# Patient Record
Sex: Female | Born: 1965 | Race: White | Hispanic: No | Smoking: Former smoker
Health system: Southern US, Community
[De-identification: ages and names within clinical notes are randomized; demographics above are authoritative.]

## PROBLEM LIST (undated history)

## (undated) DIAGNOSIS — F419 Anxiety disorder, unspecified: Secondary | ICD-10-CM

## (undated) DIAGNOSIS — Z87442 Personal history of urinary calculi: Secondary | ICD-10-CM

## (undated) DIAGNOSIS — M1990 Unspecified osteoarthritis, unspecified site: Secondary | ICD-10-CM

## (undated) DIAGNOSIS — F32A Depression, unspecified: Secondary | ICD-10-CM

## (undated) DIAGNOSIS — IMO0002 Reserved for concepts with insufficient information to code with codable children: Secondary | ICD-10-CM

## (undated) DIAGNOSIS — D649 Anemia, unspecified: Secondary | ICD-10-CM

## (undated) DIAGNOSIS — T7840XA Allergy, unspecified, initial encounter: Secondary | ICD-10-CM

## (undated) DIAGNOSIS — F909 Attention-deficit hyperactivity disorder, unspecified type: Secondary | ICD-10-CM

## (undated) DIAGNOSIS — J45909 Unspecified asthma, uncomplicated: Secondary | ICD-10-CM

## (undated) DIAGNOSIS — G709 Myoneural disorder, unspecified: Secondary | ICD-10-CM

## (undated) DIAGNOSIS — G35 Multiple sclerosis: Secondary | ICD-10-CM

## (undated) DIAGNOSIS — E039 Hypothyroidism, unspecified: Secondary | ICD-10-CM

## (undated) DIAGNOSIS — Z5189 Encounter for other specified aftercare: Secondary | ICD-10-CM

## (undated) HISTORY — DX: Anemia, unspecified: D64.9

## (undated) HISTORY — PX: ABDOMINAL HYSTERECTOMY: SHX81

## (undated) HISTORY — DX: Depression, unspecified: F32.A

## (undated) HISTORY — PX: APPENDECTOMY: SHX54

## (undated) HISTORY — DX: Anxiety disorder, unspecified: F41.9

## (undated) HISTORY — DX: Reserved for concepts with insufficient information to code with codable children: IMO0002

## (undated) HISTORY — DX: Unspecified osteoarthritis, unspecified site: M19.90

## (undated) HISTORY — DX: Encounter for other specified aftercare: Z51.89

## (undated) HISTORY — PX: CHOLECYSTECTOMY: SHX55

## (undated) HISTORY — DX: Allergy, unspecified, initial encounter: T78.40XA

---

## 2021-09-09 ENCOUNTER — Other Ambulatory Visit: Payer: Self-pay

## 2021-09-09 ENCOUNTER — Encounter (INDEPENDENT_AMBULATORY_CARE_PROVIDER_SITE_OTHER): Payer: Self-pay

## 2021-09-09 ENCOUNTER — Encounter: Payer: Self-pay | Admitting: Family Medicine

## 2021-09-09 ENCOUNTER — Ambulatory Visit (INDEPENDENT_AMBULATORY_CARE_PROVIDER_SITE_OTHER): Payer: Medicare Other | Admitting: Family Medicine

## 2021-09-09 VITALS — BP 119/79 | HR 65 | Temp 97.8°F | Resp 16 | Ht 62.5 in | Wt 219.0 lb

## 2021-09-09 DIAGNOSIS — F988 Other specified behavioral and emotional disorders with onset usually occurring in childhood and adolescence: Secondary | ICD-10-CM | POA: Diagnosis not present

## 2021-09-09 DIAGNOSIS — E039 Hypothyroidism, unspecified: Secondary | ICD-10-CM

## 2021-09-09 DIAGNOSIS — Z8619 Personal history of other infectious and parasitic diseases: Secondary | ICD-10-CM

## 2021-09-09 DIAGNOSIS — Z7689 Persons encountering health services in other specified circumstances: Secondary | ICD-10-CM

## 2021-09-09 DIAGNOSIS — G35 Multiple sclerosis: Secondary | ICD-10-CM

## 2021-09-09 DIAGNOSIS — Z780 Asymptomatic menopausal state: Secondary | ICD-10-CM

## 2021-09-09 MED ORDER — ESTRADIOL 0.1 MG/24HR TD PTTW: MEDICATED_PATCH | TRANSDERMAL | 2 refills | Status: DC

## 2021-09-09 MED ORDER — LEVOTHYROXINE SODIUM 175 MCG PO TABS: 175.0000 ug | ORAL_TABLET | Freq: Every morning | ORAL | 0 refills | Status: DC

## 2021-09-09 MED ORDER — ACYCLOVIR 400 MG PO TABS: 400.0000 mg | ORAL_TABLET | Freq: Every day | ORAL | 0 refills | Status: DC

## 2021-09-09 MED ORDER — GABAPENTIN 400 MG PO CAPS: 400.0000 mg | ORAL_CAPSULE | Freq: Three times a day (TID) | ORAL | 0 refills | Status: DC

## 2021-09-09 MED ORDER — TIZANIDINE HCL 4 MG PO TABS: ORAL_TABLET | ORAL | 3 refills | Status: DC

## 2021-09-09 MED ORDER — AMPHETAMINE-DEXTROAMPHET ER 30 MG PO CP24: 30.0000 mg | ORAL_CAPSULE | Freq: Every day | ORAL | 0 refills | Status: DC

## 2021-09-09 NOTE — Progress Notes (Signed)
Patient is here to est care with provider.Patient move here from [place] in May

## 2021-09-09 NOTE — Progress Notes (Signed)
New Patient Office Visit  Subjective:  Patient ID: Amy Burgess, female    DOB: 12/24/1965  Age: 55 y.o. MRN: [id number]  CC:  Chief Complaint  Patient presents with   Establish Care    HPI Amy Burgess presents for to establish care and for review of chronic med issues with med refills. Patient has recently relocated to the area from a different state.  No past medical history on file.   No family history on file.  Social History   Socioeconomic History   Marital status: Not on file    Spouse name: Not on file   Number of children: Not on file   Years of education: Not on file   Highest education level: Not on file  Occupational History   Not on file  Tobacco Use   Smoking status: Not on file   Smokeless tobacco: Not on file  Substance and Sexual Activity   Alcohol use: Not on file   Drug use: Not on file   Sexual activity: Not on file  Other Topics Concern   Not on file  Social History Narrative   Not on file   Social Determinants of Health   Financial Resource Strain: Not on file  Food Insecurity: Not on file  Transportation Needs: Not on file  Physical Activity: Not on file  Stress: Not on file  Social Connections: Not on file  Intimate Partner Violence: Not on file    ROS Review of Systems  Psychiatric/Behavioral:  Positive for decreased concentration. The patient is not nervous/anxious and is not hyperactive.   All other systems reviewed and are negative.  Objective:   Today's Vitals: BP 119/79   Pulse 65   Temp 97.8 F (36.6 C) (Oral)   Resp 16   Ht 5' 2.5" (1.588 m)   Wt 219 lb (99.3 kg)   SpO2 96%   BMI 39.42 kg/m   Physical Exam Vitals and nursing note reviewed.  Constitutional:      General: She is not in acute distress. Neck:     Thyroid: No thyromegaly.  Cardiovascular:     Rate and Rhythm: Normal rate and regular rhythm.  Pulmonary:     Effort: Pulmonary effort is normal.     Breath sounds: Normal breath  sounds.  Abdominal:     Palpations: Abdomen is soft.     Tenderness: There is no abdominal tenderness.  Musculoskeletal:     Cervical back: Normal range of motion and neck supple.  Neurological:     General: No focal deficit present.     Mental Status: She is alert and oriented to person, place, and time.  Psychiatric:        Mood and Affect: Mood normal.        Behavior: Behavior normal.    Assessment & Plan:   1. Hypothyroidism, unspecified type Appears stable. Continue present management. Meds refilled - levothyroxine (SYNTHROID) 175 MCG tablet; Take 1 tablet (175 mcg total) by mouth every morning.  Dispense: 90 tablet; Refill: 0  2. MS (multiple sclerosis) (HCC) Referral to neuro for continued/further evaluation and management. Meds refilled.  - gabapentin (NEURONTIN) 400 MG capsule; Take 1 capsule (400 mg total) by mouth 3 (three) times daily.  Dispense: 270 capsule; Refill: 0 - Ambulatory referral to Neurology - tiZANidine (ZANAFLEX) 4 MG tablet; tizanidine 4 mg tablet TAKE 1 TABLET BY MOUTH THREE TIMES A DAY AS NEEDED  Dispense: 90 tablet; Refill: 3  3. 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 Patient to obtain previous med records. - amphetamine-dextroamphetamine (ADDERALL XR) 30 MG 24 hr capsule; Take 1 capsule (30 mg total) by mouth daily.  Dispense: 30 capsule; Refill: 0  4. History of herpes labialis Required med 2/2 MS treatment per patient. Med refilled. - acyclovir (ZOVIRAX) 400 MG tablet; Take 1 tablet (400 mg total) by mouth daily.  Dispense: 90 tablet; Refill: 0  5. Post-menopausal Appears stable. Continue present management. Meds refilled.  - estradiol (VIVELLE-DOT) 0.1 MG/24HR patch; estradiol 0.1 mg/24 hr semiweekly transdermal patch  APPLY 1 PATCHED TOPICALLY TWICE WEEKLY  Dispense: 8 patch; Refill: 2  6. Encounter to establish care     Outpatient Encounter Medications as of 09/09/2021  Medication Sig   Cetirizine HCl  (ZYRTEC ALLERGY) 10 MG CAPS Zyrtec   fluticasone (FLONASE) 50 MCG/ACT nasal spray fluticasone propionate 50 mcg/actuation nasal spray,suspension  USE 1 SPRAY NASALLY IN EACH NOSTRIL ONCE DAILY   [DISCONTINUED] acyclovir (ZOVIRAX) 400 MG tablet acyclovir 400 mg tablet  TAKE 1 TABLET BY MOUTH DAILY. START DAY BEFORE INFUSION   [DISCONTINUED] amphetamine-dextroamphetamine (ADDERALL XR) 30 MG 24 hr capsule dextroamphetamine-amphetamine ER 30 mg 24hr capsule,extend release  TAKE 1 CAPSULE BY MOUTH EVERY DAY   [DISCONTINUED] estradiol (VIVELLE-DOT) 0.1 MG/24HR patch estradiol 0.1 mg/24 hr semiweekly transdermal patch  APPLY 1 PATCHED TOPICALLY TWICE WEEKLY   [DISCONTINUED] gabapentin (NEURONTIN) 400 MG capsule gabapentin 400 mg capsule   [DISCONTINUED] levothyroxine (SYNTHROID) 175 MCG tablet Take 175 mcg by mouth every morning.   [DISCONTINUED] tiZANidine (ZANAFLEX) 4 MG tablet tizanidine 4 mg tablet  TAKE 1 TABLET BY MOUTH THREE TIMES A DAY AS NEEDED   acyclovir (ZOVIRAX) 400 MG tablet Take 1 tablet (400 mg total) by mouth daily.   amphetamine-dextroamphetamine (ADDERALL XR) 30 MG 24 hr capsule Take 1 capsule (30 mg total) by mouth daily.   estradiol (VIVELLE-DOT) 0.1 MG/24HR patch estradiol 0.1 mg/24 hr semiweekly transdermal patch  APPLY 1 PATCHED TOPICALLY TWICE WEEKLY   gabapentin (NEURONTIN) 400 MG capsule Take 1 capsule (400 mg total) by mouth 3 (three) times daily.   levothyroxine (SYNTHROID) 175 MCG tablet Take 1 tablet (175 mcg total) by mouth every morning.   tiZANidine (ZANAFLEX) 4 MG tablet tizanidine 4 mg tablet TAKE 1 TABLET BY MOUTH THREE TIMES A DAY AS NEEDED   No facility-administered encounter medications on file as of 09/09/2021.    Follow-up: Return in about 3 months (around 12/10/2021) for follow up, chronic med issues.   Tommie Raymond, MD

## 2021-09-24 ENCOUNTER — Ambulatory Visit: Payer: Medicare Other | Admitting: Neurology

## 2021-10-16 ENCOUNTER — Other Ambulatory Visit: Payer: Self-pay | Admitting: Family Medicine

## 2021-10-16 ENCOUNTER — Encounter: Payer: Self-pay | Admitting: Family Medicine

## 2021-10-16 DIAGNOSIS — F988 Other specified behavioral and emotional disorders with onset usually occurring in childhood and adolescence: Secondary | ICD-10-CM

## 2021-10-21 ENCOUNTER — Ambulatory Visit (INDEPENDENT_AMBULATORY_CARE_PROVIDER_SITE_OTHER): Payer: Medicare Other | Admitting: Neurology

## 2021-10-21 ENCOUNTER — Other Ambulatory Visit: Payer: Self-pay

## 2021-10-21 ENCOUNTER — Encounter: Payer: Self-pay | Admitting: Neurology

## 2021-10-21 ENCOUNTER — Telehealth: Payer: Self-pay | Admitting: *Deleted

## 2021-10-21 VITALS — BP 131/83 | HR 76 | Ht 62.5 in | Wt 224.0 lb

## 2021-10-21 DIAGNOSIS — R269 Unspecified abnormalities of gait and mobility: Secondary | ICD-10-CM

## 2021-10-21 DIAGNOSIS — F988 Other specified behavioral and emotional disorders with onset usually occurring in childhood and adolescence: Secondary | ICD-10-CM

## 2021-10-21 DIAGNOSIS — G35 Multiple sclerosis: Secondary | ICD-10-CM

## 2021-10-21 MED ORDER — GABAPENTIN 800 MG PO TABS: 800.0000 mg | ORAL_TABLET | Freq: Two times a day (BID) | ORAL | 3 refills | Status: DC

## 2021-10-21 MED ORDER — TIZANIDINE HCL 4 MG PO TABS: ORAL_TABLET | ORAL | 3 refills | Status: DC

## 2021-10-21 MED ORDER — AMPHETAMINE-DEXTROAMPHET ER 30 MG PO CP24: 30.0000 mg | ORAL_CAPSULE | Freq: Every day | ORAL | 0 refills | Status: DC

## 2021-10-21 NOTE — Progress Notes (Signed)
[facility]  PATIENT: Amy Burgess DOB: 18-Oct-1965  REFERRING DOCTOR OR PCP: Georganna Skeans, MD SOURCE: Patient, notes from primary care,  _________________________________   HISTORICAL  CHIEF COMPLAINT:  Chief Complaint  Patient presents with   New Patient (Initial Visit)    Pt alone, rm 2. Establishing here for treatment of MS. Diagnosed jan 2005 with MS. She is off DMT she had previously been on Avonex 2009. She developed antibodies and put on a trial. Wasn't doing well on the trial. Balance has recently been off lately.     HISTORY OF PRESENT ILLNESS:  I had the pleasure of seeing your patient, Amy Burgess, at [facility] for neurologic consultation regarding her multiple sclerosis.  She is a 56 year old woman who was diagnosed with MS in 2005.   She had balance issues and slurred speech.  She had a positive FH of MS (her paternal grandmother).    She was placed on Avonex but developed antibodies.    She saw Dr. Dr. Araceli Bouche in [place], [place]. MRi was reportedly c/w MS.  She also had a lumbar puncture.   She was in the [place] trial (Dr. Araceli Bouche) and had treatments in 2009, 2010 and 2018.   She had frequent  MRI as part of the trial and reports she had progression.Marland Kitchen   She was on Gilenya briefly in 2017.   Due to new onset vertigo, she had an MRI and was told she had no new lesions.      Her last brain MRI was performed 01/02/2021 in [place].  She was able to pull up the results on her phone.  We do not have the images.  It was read as showing tiny infarcts in cerebellum and non-specific white matter changes but no acute findings.   It was performed at [facility]  in [place], [place]    (Phone [phone] / Fax [phone]).  We will try to get the images.  She feels her gait is mildly off.  This fluctuates quite a bit.    She felt worse with a recent URI.    She uses a cane intermittently.   She notes weakness in legs and numbness  in feet with some pain.    She takes tizanidine up to 4 mg po tid (varies dose) for spasticity.  She is on gabapentin 400 mg po tid prn (often skips a daytim dose if sleepy).    She has some urinary incontinence and uses pads.     She has had kidney stones in past.   Ditropan dried her out so she prefers not to be on one.   Vision is mildly off.  She has increased fatigue.    She sleeps well at night.   She does not snore.  She notes cognitive issues with brain fog and poor focus.    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     Nuvigil was less effective.      She is on Adderall XR 30 mg po qd.    She notes some depression and irritability.      She had tried Medical marijuana (edibles) with benefit in [place].   We discussed may be an option if she has a [place] doctor but not a n option here.  .     She is a retired Film/video editor.     REVIEW OF SYSTEMS: Constitutional: No fevers, chills, sweats, or change in appetite.  She has fatigue. Eyes: No visual changes, double vision, eye pain Ear, nose and throat: No hearing loss, ear pain, nasal congestion, sore throat Cardiovascular: No chest pain, palpitations Respiratory:  No shortness of breath at rest or with exertion.   No wheezes GastrointestinaI: No nausea, vomiting, diarrhea, abdominal pain, fecal incontinence Genitourinary:  No dysuria, urinary retention or frequency.  No nocturia. Musculoskeletal:  No neck pain, back pain Integumentary: No rash, pruritus, skin lesions Neurological: as above Psychiatric: No depression at this time.  No anxiety Endocrine: No palpitations, diaphoresis, change in appetite, change in weigh or increased thirst Hematologic/Lymphatic:  No anemia, purpura, petechiae. Allergic/Immunologic: No itchy/runny eyes, nasal congestion, recent allergic reactions, rashes  ALLERGIES: Allergies  Allergen Reactions   Ivp Dye [Iodinated Contrast Media]     HOME  MEDICATIONS:  Current Outpatient Medications:    acyclovir (ZOVIRAX) 400 MG tablet, Take 1 tablet (400 mg total) by mouth daily., Disp: 90 tablet, Rfl: 0   amphetamine-dextroamphetamine (ADDERALL XR) 30 MG 24 hr capsule, Take 1 capsule (30 mg total) by mouth daily., Disp: 30 capsule, Rfl: 0   Cetirizine HCl (ZYRTEC ALLERGY) 10 MG CAPS, Zyrtec, Disp: , Rfl:    estradiol (VIVELLE-DOT) 0.1 MG/24HR patch, estradiol 0.1 mg/24 hr semiweekly transdermal patch  APPLY 1 PATCHED TOPICALLY TWICE WEEKLY, Disp: 8 patch, Rfl: 2   fluticasone (FLONASE) 50 MCG/ACT nasal spray, fluticasone propionate 50 mcg/actuation nasal spray,suspension  USE 1 SPRAY NASALLY IN EACH NOSTRIL ONCE DAILY, Disp: , Rfl:    gabapentin (NEURONTIN) 400 MG capsule, Take 1 capsule (400 mg total) by mouth 3 (three) times daily., Disp: 270 capsule, Rfl: 0   levothyroxine (SYNTHROID) 175 MCG tablet, Take 1 tablet (175 mcg total) by mouth every morning., Disp: 90 tablet, Rfl: 0   tiZANidine (ZANAFLEX) 4 MG tablet, tizanidine 4 mg tablet TAKE 1 TABLET BY MOUTH THREE TIMES A DAY AS NEEDED, Disp: 90 tablet, Rfl: 3  PAST MEDICAL HISTORY: No past medical history on file.  PAST SURGICAL HISTORY: Past Surgical History:  Procedure Laterality Date   APPENDECTOMY     CESAREAN SECTION     CHOLECYSTECTOMY      FAMILY HISTORY: Family History  Problem Relation Age of Onset   Heart attack Mother    Hypertension Father    Diabetes type II Father    Multiple sclerosis Paternal Grandmother     SOCIAL HISTORY:  Social History   Socioeconomic History   Marital status: Divorced    Spouse name: Not on file   Number of children: Not on file   Years of education: Not on file   Highest education level: Not on file  Occupational History   Not on file  Tobacco Use   Smoking status: Former    Types: Cigarettes    Quit date: 1985    Years since quitting: 38.0   Smokeless tobacco: Never  Substance and Sexual Activity   Alcohol use: Yes     Comment: socaially   Drug use: Never   Sexual activity: Not on file  Other Topics Concern   Not on file  Social History Narrative   Not on file   Social Determinants of Health   Financial Resource Strain: Not on file  Food Insecurity: Not on file  Transportation Needs: Not on file  Physical Activity: Not on file  Stress: Not on file  Social Connections: Not on file  Intimate Partner Violence: Not on file     PHYSICAL EXAM  Vitals:   10/21/21 1610  BP: 131/83  Pulse: 76  Weight: 224 lb (101.6 kg)  Height: 5' 2.5" (1.588 m)    Body mass index is 40.32 kg/m.   General: The patient is well-developed and well-nourished and in no acute distress  HEENT:  Head is [place]/AT.  Sclera are anicteric.  Funduscopic exam shows normal optic discs and retinal vessels.  Neck: No carotid bruits are noted.  The neck is nontender.  Cardiovascular: The heart has a regular rate and rhythm with a normal S1 and S2. There were no murmurs, gallops or rubs.    Skin: Extremities are without rash or  edema.  Musculoskeletal:  Back is nontender  Neurologic Exam  Mental status: The patient is alert and oriented x 3 at the time of the examination. The patient has apparent normal recent and remote memory, with an apparently normal attention span and concentration ability.   Speech is normal.  Cranial nerves: Extraocular movements are full. Pupils are equal, round, and reactive to light and accomodation. Color vision is symmetric.    Facial symmetry is present. There is good facial sensation to soft touch bilaterally.Facial strength is normal.  Trapezius and sternocleidomastoid strength is normal. No dysarthria is noted.   No obvious hearing deficits are noted.  Motor:  Muscle bulk is normal.   Tone is mildly inc in right leg. Strength is  5 / 5 in all 4 extremities except 4+/5 right iliopsoas.   Sensory: Sensory testing is intact to pinprick, soft touch and vibration sensation in amrs.   She reports  reduced right leg vibration sensation.    Coordination: Cerebellar testing reveals good finger-nose-finger and heel-to-shin bilaterally.  Gait and station: Station is normal.   Gait has slight limp (right) and tandem is mildly wide.   Tandem gait is normal. Romberg is negative.   Reflexes: Deep tendon reflexes are symmetric and normal in arms and mildly increased right ankle.     Plantar responses are flexor.      ASSESSMENT AND PLAN  Multiple sclerosis (HCC) - Plan: MR BRAIN W WO CONTRAST, MR CERVICAL SPINE W WO CONTRAST, MR THORACIC SPINE W WO CONTRAST  Gait disturbance - Plan: MR BRAIN W WO CONTRAST, MR CERVICAL SPINE W WO CONTRAST, MR THORACIC SPINE W WO CONTRAST  Attention deficit disorder (ADD) without hyperactivity - Plan: amphetamine-dextroamphetamine (ADDERALL XR) 30 MG 24 hr capsule  MS (multiple sclerosis) (HCC) - Plan: tiZANidine (ZANAFLEX) 4 MG tablet   In summary, Ms. Krogh is a 56 year old woman who was diagnosed with multiple sclerosis in 2005 and was actually in the elotuzumab drug study.  She went into the open label extension so had multiple MRIs.  The wording of the last radiology report seemed less definite about the diagnosis.  I will try to get the images for personal review.  She reports that she has had some progression of symptoms with worsening gait and balance over the past couple of years.  This has been slow changes over time.  We will check an MRI of the brain, cervical and thoracic spine to further evaluate to assess for differences since her last MRI and determine if there is any progression.  If so, we would need to consider adding a disease modifying therapy (patients can have benefit for many years after [place]).  Additionally this will allow [place] to rule out intrinsic and extrinsic myelopathy that might be playing a role with her progressive gait disturbance  I will also send in prescription for gabapentin, tizanidine and Adderall for  symptoms related to  MS.  She will return to see me in 6 months or sooner if there are new or worsening neurologic symptoms.  Thank you for asking me to see Ms. Hamman.  Please let me know if I can be of further assistance with her or other patients in the future.    Xavian Hardcastle A. Epimenio Foot, MD, [facility] 10/21/2021, 9:10 AM Certified in Neurology, Clinical Neurophysiology, Sleep Medicine and Neuroimaging  [facility] [address] [place], [place] [address] [phone]

## 2021-10-21 NOTE — Telephone Encounter (Addendum)
Request faxed to [facility] [phone] and [facility] [phone]

## 2021-10-24 ENCOUNTER — Other Ambulatory Visit: Payer: Self-pay | Admitting: Family Medicine

## 2021-10-24 DIAGNOSIS — Z1231 Encounter for screening mammogram for malignant neoplasm of breast: Secondary | ICD-10-CM

## 2021-10-29 ENCOUNTER — Other Ambulatory Visit: Payer: Medicare Other

## 2021-10-30 ENCOUNTER — Ambulatory Visit
Admission: RE | Admit: 2021-10-30 | Discharge: 2021-10-30 | Disposition: A | Discharge status: Home / Self Care | Payer: Medicare Other | Source: Ambulatory Visit | Attending: Neurology | Admitting: Neurology

## 2021-10-30 DIAGNOSIS — G35 Multiple sclerosis: Secondary | ICD-10-CM

## 2021-10-30 DIAGNOSIS — R269 Unspecified abnormalities of gait and mobility: Secondary | ICD-10-CM

## 2021-10-30 MED ORDER — GADOBENATE DIMEGLUMINE 529 MG/ML IV SOLN
20.0000 mL | Freq: Once | INTRAVENOUS | Status: AC | PRN
  Administered 2021-10-30: 20 mL via INTRAVENOUS

## 2021-10-31 ENCOUNTER — Telehealth: Payer: Self-pay | Admitting: Neurology

## 2021-10-31 NOTE — Telephone Encounter (Signed)
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  Elsewhere in the hemispheres there are some scattered a few T2/FLAIR hyperintense foci.  1 in the right atrial periventricular white matter is radially oriented to the ventricle.  I discussed the results with her.  We will try to get her old MRIs for comparison.  She also will be having an MRI of the thoracic spine over the weekend.

## 2021-11-01 ENCOUNTER — Other Ambulatory Visit: Payer: Medicare Other

## 2021-11-02 ENCOUNTER — Other Ambulatory Visit: Payer: Self-pay

## 2021-11-02 ENCOUNTER — Ambulatory Visit
Admission: RE | Admit: 2021-11-02 | Discharge: 2021-11-02 | Disposition: A | Discharge status: Home / Self Care | Payer: Medicare Other | Source: Ambulatory Visit | Attending: Neurology | Admitting: Neurology

## 2021-11-02 DIAGNOSIS — R269 Unspecified abnormalities of gait and mobility: Secondary | ICD-10-CM | POA: Diagnosis not present

## 2021-11-02 DIAGNOSIS — G35 Multiple sclerosis: Secondary | ICD-10-CM

## 2021-11-02 MED ORDER — GADOBENATE DIMEGLUMINE 529 MG/ML IV SOLN
20.0000 mL | Freq: Once | INTRAVENOUS | Status: AC | PRN
  Administered 2021-11-02: 20 mL via INTRAVENOUS

## 2021-11-05 ENCOUNTER — Other Ambulatory Visit: Payer: Self-pay

## 2021-11-05 ENCOUNTER — Ambulatory Visit
Admission: RE | Admit: 2021-11-05 | Discharge: 2021-11-05 | Disposition: A | Discharge status: Home / Self Care | Payer: Medicare Other | Source: Ambulatory Visit

## 2021-11-05 DIAGNOSIS — Z1231 Encounter for screening mammogram for malignant neoplasm of breast: Secondary | ICD-10-CM

## 2021-11-05 NOTE — Telephone Encounter (Signed)
R/c cd from [facility] cd in nurse pod.

## 2021-11-19 ENCOUNTER — Other Ambulatory Visit: Payer: Self-pay | Admitting: Neurology

## 2021-11-19 DIAGNOSIS — F988 Other specified behavioral and emotional disorders with onset usually occurring in childhood and adolescence: Secondary | ICD-10-CM

## 2021-11-19 MED ORDER — AMPHETAMINE-DEXTROAMPHET ER 30 MG PO CP24: 30.0000 mg | ORAL_CAPSULE | Freq: Every day | ORAL | 0 refills | Status: DC

## 2021-12-02 ENCOUNTER — Other Ambulatory Visit: Payer: Self-pay | Admitting: *Deleted

## 2021-12-02 DIAGNOSIS — Z8619 Personal history of other infectious and parasitic diseases: Secondary | ICD-10-CM

## 2021-12-02 MED ORDER — ACYCLOVIR 400 MG PO TABS: 400.0000 mg | ORAL_TABLET | Freq: Every day | ORAL | 0 refills | Status: DC

## 2021-12-09 ENCOUNTER — Other Ambulatory Visit: Payer: Self-pay

## 2021-12-09 ENCOUNTER — Ambulatory Visit (INDEPENDENT_AMBULATORY_CARE_PROVIDER_SITE_OTHER): Payer: Medicare Other | Admitting: Family Medicine

## 2021-12-09 ENCOUNTER — Encounter: Payer: Self-pay | Admitting: Family Medicine

## 2021-12-09 VITALS — BP 118/80 | HR 77 | Temp 97.7°F | Resp 16 | Wt 218.0 lb

## 2021-12-09 DIAGNOSIS — Z130 Encounter for screening for diseases of the blood and blood-forming organs and certain disorders involving the immune mechanism: Secondary | ICD-10-CM

## 2021-12-09 DIAGNOSIS — E039 Hypothyroidism, unspecified: Secondary | ICD-10-CM

## 2021-12-09 DIAGNOSIS — Z0000 Encounter for general adult medical examination without abnormal findings: Secondary | ICD-10-CM

## 2021-12-09 DIAGNOSIS — Z6839 Body mass index (BMI) 39.0-39.9, adult: Secondary | ICD-10-CM

## 2021-12-09 DIAGNOSIS — Z13220 Encounter for screening for lipoid disorders: Secondary | ICD-10-CM | POA: Diagnosis not present

## 2021-12-09 DIAGNOSIS — Z13228 Encounter for screening for other metabolic disorders: Secondary | ICD-10-CM

## 2021-12-09 DIAGNOSIS — Z1329 Encounter for screening for other suspected endocrine disorder: Secondary | ICD-10-CM

## 2021-12-09 NOTE — Progress Notes (Signed)
Established Patient Office Visit  Subjective:  Patient ID: Amy Burgess, female    DOB: 08/25/1966  Age: 56 y.o. MRN: [id number]  CC:  Chief Complaint  Patient presents with   Annual Exam    HPI Amy Burgess presents for routine annual exam. Patient denies acute complaints or concerns.   No past medical history on file.  Past Surgical History:  Procedure Laterality Date   APPENDECTOMY     CESAREAN SECTION     CHOLECYSTECTOMY      Family History  Problem Relation Age of Onset   Heart attack Mother    Hypertension Father    Diabetes type II Father    Multiple sclerosis Paternal Grandmother     Social History   Socioeconomic History   Marital status: Divorced    Spouse name: Not on file   Number of children: Not on file   Years of education: Not on file   Highest education level: Not on file  Occupational History   Not on file  Tobacco Use   Smoking status: Former    Types: Cigarettes    Quit date: 16    Years since quitting: 38.1   Smokeless tobacco: Never  Substance and Sexual Activity   Alcohol use: Yes    Comment: socaially   Drug use: Never   Sexual activity: Not on file  Other Topics Concern   Not on file  Social History Narrative   Not on file   Social Determinants of Health   Financial Resource Strain: Not on file  Food Insecurity: Not on file  Transportation Needs: Not on file  Physical Activity: Not on file  Stress: Not on file  Social Connections: Not on file  Intimate Partner Violence: Not on file    ROS Review of Systems  All other systems reviewed and are negative.  Objective:   Today's Vitals: BP 118/80    Pulse 77    Temp 97.7 F (36.5 C)    Resp 16    Wt 218 lb (98.9 kg)    SpO2 97%    BMI 39.24 kg/m   Physical Exam Vitals and nursing note reviewed.  Constitutional:      General: She is not in acute distress.    Appearance: She is obese.  HENT:     Head: Normocephalic and atraumatic.     Right Ear:  Tympanic membrane, ear canal and external ear normal.     Left Ear: Tympanic membrane, ear canal and external ear normal.     Nose: Nose normal.     Mouth/Throat:     Mouth: Mucous membranes are moist.     Pharynx: Oropharynx is clear.  Eyes:     Conjunctiva/sclera: Conjunctivae normal.     Pupils: Pupils are equal, round, and reactive to light.  Neck:     Thyroid: No thyromegaly.  Cardiovascular:     Rate and Rhythm: Normal rate and regular rhythm.     Heart sounds: Normal heart sounds. No murmur heard. Pulmonary:     Effort: Pulmonary effort is normal. No respiratory distress.     Breath sounds: Normal breath sounds.  Abdominal:     General: There is no distension.     Palpations: Abdomen is soft. There is no mass.     Tenderness: There is no abdominal tenderness.  Musculoskeletal:        General: Normal range of motion.     Cervical back: Normal range of motion and neck  supple.  Skin:    General: Skin is warm and dry.  Neurological:     General: No focal deficit present.     Mental Status: She is alert and oriented to person, place, and time.  Psychiatric:        Mood and Affect: Mood normal.        Behavior: Behavior normal.    Assessment & Plan:   1. Annual physical exam Routine labs ordered  - CMP14+EGFR - CBC with Differential  2. Hypothyroidism, unspecified type Monitoring labs ordered. - TSH - T4, Free  3. Screening for endocrine/metabolic/immunity disorders  - Hemoglobin A1c - Vitamin D, 25-hydroxy  4. Screening for lipid disorders  - Lipid Panel  5. Body mass index (BMI) 39.0-39.9, adult  - Vitamin D, 25-hydroxy    Outpatient Encounter Medications as of 12/09/2021  Medication Sig   acyclovir (ZOVIRAX) 400 MG tablet Take 1 tablet (400 mg total) by mouth daily.   amphetamine-dextroamphetamine (ADDERALL XR) 30 MG 24 hr capsule Take 1 capsule (30 mg total) by mouth daily.   Cetirizine HCl (ZYRTEC ALLERGY) 10 MG CAPS Zyrtec   estradiol  (VIVELLE-DOT) 0.1 MG/24HR patch estradiol 0.1 mg/24 hr semiweekly transdermal patch  APPLY 1 PATCHED TOPICALLY TWICE WEEKLY   fluticasone (FLONASE) 50 MCG/ACT nasal spray fluticasone propionate 50 mcg/actuation nasal spray,suspension  USE 1 SPRAY NASALLY IN EACH NOSTRIL ONCE DAILY   gabapentin (NEURONTIN) 800 MG tablet Take 1 tablet (800 mg total) by mouth 2 (two) times daily.   levothyroxine (SYNTHROID) 175 MCG tablet Take 1 tablet (175 mcg total) by mouth every morning.   tiZANidine (ZANAFLEX) 4 MG tablet tizanidine 4 mg tablet TAKE 1 TABLET BY MOUTH THREE TIMES A DAY AS NEEDED   No facility-administered encounter medications on file as of 12/09/2021.    Follow-up: No follow-ups on file.   Becky Sax, MD

## 2021-12-09 NOTE — Progress Notes (Signed)
Patient is here for CPE . Patient said she has no other concerns today

## 2021-12-10 LAB — CBC WITH DIFFERENTIAL/PLATELET
Basophils Absolute: 0 10*3/uL (ref 0.0–0.2)
Basos: 1 %
EOS (ABSOLUTE): 0.1 10*3/uL (ref 0.0–0.4)
Eos: 2 %
Hematocrit: 40.7 % (ref 34.0–46.6)
Hemoglobin: 13.6 g/dL (ref 11.1–15.9)
Immature Grans (Abs): 0 10*3/uL (ref 0.0–0.1)
Immature Granulocytes: 0 %
Lymphocytes Absolute: 1.2 10*3/uL (ref 0.7–3.1)
Lymphs: 21 %
MCH: 29.1 pg (ref 26.6–33.0)
MCHC: 33.4 g/dL (ref 31.5–35.7)
MCV: 87 fL (ref 79–97)
Monocytes Absolute: 0.5 10*3/uL (ref 0.1–0.9)
Monocytes: 9 %
Neutrophils Absolute: 4 10*3/uL (ref 1.4–7.0)
Neutrophils: 67 %
Platelets: 267 10*3/uL (ref 150–450)
RBC: 4.67 x10E6/uL (ref 3.77–5.28)
RDW: 13.9 % (ref 11.7–15.4)
WBC: 5.9 10*3/uL (ref 3.4–10.8)

## 2021-12-10 LAB — LIPID PANEL
Chol/HDL Ratio: 3.4 ratio (ref 0.0–4.4)
Cholesterol, Total: 206 mg/dL — ABNORMAL HIGH (ref 100–199)
HDL: 61 mg/dL (ref >39)
LDL Chol Calc (NIH): 127 mg/dL — ABNORMAL HIGH (ref 0–99)
Triglycerides: 101 mg/dL (ref 0–149)
VLDL Cholesterol Cal: 18 mg/dL (ref 5–40)

## 2021-12-10 LAB — VITAMIN D 25 HYDROXY (VIT D DEFICIENCY, FRACTURES): Vit D, 25-Hydroxy: 78.9 ng/mL (ref 30.0–100.0)

## 2021-12-10 LAB — TSH: TSH: 1.11 u[IU]/mL (ref 0.450–4.500)

## 2021-12-10 LAB — HEMOGLOBIN A1C
Est. average glucose Bld gHb Est-mCnc: 103 mg/dL
Hgb A1c MFr Bld: 5.2 % (ref 4.8–5.6)

## 2021-12-10 LAB — T4, FREE: Free T4: 1.93 ng/dL — ABNORMAL HIGH (ref 0.82–1.77)

## 2021-12-11 ENCOUNTER — Encounter: Payer: Self-pay | Admitting: Family Medicine

## 2021-12-12 ENCOUNTER — Other Ambulatory Visit: Payer: Self-pay | Admitting: Family Medicine

## 2021-12-12 MED ORDER — LEVOTHYROXINE SODIUM 150 MCG PO TABS: 150.0000 ug | ORAL_TABLET | Freq: Every day | ORAL | 0 refills | Status: DC

## 2021-12-16 ENCOUNTER — Other Ambulatory Visit: Payer: Self-pay | Admitting: Neurology

## 2021-12-16 DIAGNOSIS — F988 Other specified behavioral and emotional disorders with onset usually occurring in childhood and adolescence: Secondary | ICD-10-CM

## 2021-12-17 MED ORDER — AMPHETAMINE-DEXTROAMPHET ER 30 MG PO CP24: 30.0000 mg | ORAL_CAPSULE | Freq: Every day | ORAL | 0 refills | Status: DC

## 2021-12-17 NOTE — Telephone Encounter (Signed)
Last OV was on 10/21/21.  ?Next OV is scheduled for 04/22/22 .  ?Last RX was written on 11/21/21 for 30 tabs.  ? ?[facility] has been reviewed.  ?

## 2021-12-18 LAB — CMP14+EGFR

## 2021-12-24 ENCOUNTER — Encounter: Payer: Self-pay | Admitting: Family Medicine

## 2021-12-27 ENCOUNTER — Other Ambulatory Visit: Payer: Self-pay | Admitting: Urology

## 2021-12-31 ENCOUNTER — Encounter (HOSPITAL_BASED_OUTPATIENT_CLINIC_OR_DEPARTMENT_OTHER): Payer: Self-pay | Admitting: Urology

## 2021-12-31 NOTE — Progress Notes (Signed)
Patient to arrive at 1430 on 01/02/2022. History and medications reviewed. Pre-procedure instructions given. NPO after 1030 on day of procedure except for clear liquids until 1230. Driver secured. ?

## 2022-01-01 NOTE — H&P (Signed)
? ? ?Office Visit Report     12/27/2021  ? ?-------------------------------------------------------------------------------- ?  ?Amy Burgess  ?MRN: [id number]  ?DOB: 02-Jun-1966, 56 year old Female  ?SSN:   ? PRIMARY CARE:  Georganna Skeans  ?REFERRING:  Georganna Skeans  ?PROVIDER:  Karoline Caldwell, M.D.  ?LOCATION:  [facility] [phone]  ?  ? ?-------------------------------------------------------------------------------- ?  ?CC/HPI: Patient is a 56 year old white female, retired Charity fundraiser who is recently moved from [place] area to [place]. Has history of stones on several occasions and has required at least 3 ESL treatments and ureteral stents in the past by her report. Last stone episode approximately 3 years ago in [place] required [employer]. Subsequently passed the stone and states that she was "stone free after this last treatment has had no imaging since that time. Since moving to the [place] area she had not had any issues until approximately 5 days ago developed acute onset right-sided flank pain with nausea but no vomiting no fever no gross hematuria. She was taking Tylenol for pain relief. Pain was intermittent in nature but was 10 out of 10 that initial night. More recently has had less severe pain but always seems to be at nighttime. Current level is 3 out of 10. Feels like prior stones and all on the right side.  ?Micro urinalysis today showed 40-60 RBCs and a few bacteria.  ?CT urogram is reviewed today and by initial review shows 4-5 mm stone at the region of L4-5 on the right just at the iliac crossing. Moderate hydro on the right. There is a very small nonobstructing stone within the right kidney as well no stones on the left in the kidney or in the ureter. Overread is pending.  ? ?  ?ALLERGIES: IVP Dye ?  ? ?MEDICATIONS: Zyrtec  ?Acyclovir  ?Adderall  ?Estradiol  ?Flonase Allergy Relief  ?Gabapentin  ?  ? ?GU PSH: None  ? ?NON-GU PSH: Gastric bypass - 08/29/2020 ? ?  ? ?GU  PMH: None  ? ?NON-GU PMH: Anxiety ?Arthritis ?Depression ?Hypothyroidism ?  ? ?FAMILY HISTORY: No Family History   ? ?SOCIAL HISTORY: Marital Status: Unknown ?  ? ?REVIEW OF SYSTEMS:    ?GU Review Female:   Patient reports frequent urination, hard to postpone urination, get up at night to urinate, and leakage of urine. Patient denies burning /pain with urination, stream starts and stops, trouble starting your stream, have to strain to urinate, and being pregnant.  ?Gastrointestinal (Upper):   Patient reports nausea. Patient denies vomiting and indigestion/ heartburn.  ?Gastrointestinal (Lower):   Patient denies diarrhea and constipation.  ?Constitutional:   Patient reports fatigue. Patient denies fever, night sweats, and weight loss.  ?Skin:   Patient denies skin rash/ lesion and itching.  ?Eyes:   Patient denies blurred vision and double vision.  ?Ears/ Nose/ Throat:   Patient denies sore throat and sinus problems.  ?Hematologic/Lymphatic:   Patient denies swollen glands and easy bruising.  ?Cardiovascular:   Patient denies leg swelling and chest pains.  ?Respiratory:   Patient denies shortness of breath and cough.  ?Endocrine:   Patient denies excessive thirst.  ?Musculoskeletal:   Patient reports back pain and joint pain.   ?Neurological:   Patient reports dizziness. Patient denies headaches.  ?Psychologic:   Patient denies depression and anxiety.  ? ?VITAL SIGNS:    ?  12/27/2021 09:18 AM  ?Weight 214 lb / 97.07 kg  ?Height 62 in / 157.48 cm  ?Pulse 67 /min  ?Temperature  99.3 F / 37.3 C  ?BMI 39.1 kg/m?  ? ?MULTI-SYSTEM PHYSICAL EXAMINATION:    ?Constitutional: Well-nourished. No physical deformities. Normally developed. Good grooming.  ?Neck: Neck symmetrical, not swollen. Normal tracheal position.  ?Respiratory: No labored breathing, no use of accessory muscles.   ?Cardiovascular: Normal temperature, normal extremity pulses, no swelling, no varicosities.  ?Lymphatic: No enlargement of neck, axillae, groin.   ?Skin: No paleness, no jaundice, no cyanosis. No lesion, no ulcer, no rash.  ?Neurologic / Psychiatric: Oriented to time, oriented to place, oriented to person. No depression, no anxiety, no agitation.  ?Eyes: Normal conjunctivae. Normal eyelids.  ?Ears, Nose, Mouth, and Throat: Left ear no scars, no lesions, no masses. Right ear no scars, no lesions, no masses. Nose no scars, no lesions, no masses. Normal hearing. Normal lips.  ?Musculoskeletal: Normal gait and station of head and neck. Mild right-sided CVA tenderness  ? ?  ?Complexity of Data:  ?Source Of History:  Patient  ?Records Review:   Previous Doctor Records, Previous Patient Records  ?Urine Test Review:   Urinalysis  ?X-Ray Review: C.T. Abdomen/Pelvis: Reviewed Films. Reviewed Report. Discussed With Patient.  ?  ? ?PROCEDURES:    ?     C.T. Urogram - [id number]  ?  ?  ?Patient confirmed No Neulasta OnPro Device.  ? ? ?     Urinalysis w/Scope ?Dipstick Dipstick Cont'd Micro  ?Color: Amber Bilirubin: Neg mg/dL WBC/hpf: NS (Not Seen)  ?Appearance: Slightly Cloudy Ketones: Neg mg/dL RBC/hpf: 40 - 68/HFG  ?Specific Gravity: 1.025 Blood: 2+ ery/uL Bacteria: Few (10-25/hpf)  ?pH: 6.0 Protein: Trace mg/dL Cystals: NS (Not Seen)  ?Glucose: Neg mg/dL Urobilinogen: 1.0 mg/dL Casts: Hyaline  ?  Nitrites: Neg Trichomonas: Not Present  ?  Leukocyte Esterase: Neg leu/uL Mucous: Present  ?    Epithelial Cells: 0 - 5/hpf  ?    Yeast: NS (Not Seen)  ?    Sperm: Not Present  ? ? ?     Ketoralac 60mg  - [id number], [id number] ?Qty: 60 Adm. By: Desmond Dike  ?Unit: mg Lot No [id number]  ?Route: IM Exp. Date 04/12/2022  ?Freq: None Mfgr.:   ?Site: Left Buttock  ? ?ASSESSMENT:  ?    ICD-10 Details  ?1 GU:   Ureteral calculus - N20.1   ? ?PLAN:    ? ? ?      Medications ?New Meds: Tamsulosin Hcl 0.4 mg capsule 1 capsule PO Daily   #30  0 Refill(s)  ?Ondansetron Hcl 4 mg tablet 1 tablet PO Q 6 H   #10  1 Refill(s)  ?Oxycodone-Acetaminophen 5 mg-325 mg tablet 1 tablet PO Q 6 H   #20  0 Refill(s)   ?[facility] [phone]  ?Address:  [address]  ? [place], [place] [address]  ?Phone:  [phone]  ?Fax:  [phone]  ?  ? ? ?      Orders ?X-Rays: [facility] Protocol Without I.V. Contrast. No Oral Contrast  ?X-Ray Notes: History: ? ?Hematuria: Yes/No ? ?Patient to see MD after exam: Yes/No ? ?Previous exam: CT / IVP/ US/ KUB/ None ? ?When: ? ?Where: ? ?Diabetic: Yes/ No ? ?BUN/ Creatinine: ? ?Date of last BUN Creatinine: ? ?Weight in pounds: ? ?Allergy- IV Contrast: Yes/ No ? ?Conflicting diabetic meds: Yes/ No ? ?Diabetic Meds: ? ?Prior Authorization #: NPCR ? ?  ? ? ?      Schedule ? ? ?      Document ?Letter(s):  Created for  Patient: Clinical Summary  ? ? ?     Notes:   Discussed CT findings showing 4 to 5 mm right mid to distal ureteral calculus at the iliac vessels with moderate hydronephrosis. Discussed treatment options including trial of passage with medical expulsive therapy versus in situ ESL versus ureteroscopy and laser lithotripsy. Patient wants to try in situ ESL as she has done well with lithotripsy in the past. Stone appears visible on CT scout image. Plan to set up for lithotripsy in the near future and will initiate tamsulosin 0.4 mg as expulsive therapy in the meantime. 60 mg IM Toradol given today. Percocet also prescribed for pain. Risk and benefits of ESL discussed as outlined below.  ?I have discussed with the patient the risks and consequences of the procedure of extracorporeal shockwave lithotripsy to include, but not limited to: Bleeding, including bleeding in the urine, bleeding around the kidney with hematoma formation and rarely bleeding to the point of loss of the kidney, infection, damage to the surrounding structures including soft tissue and bowel perforation, residual stone fragments requiring the need for future treatments including endoscopic surgery, open surgery or percutaneous surgery. I have emphasized that study showed that up to 25% of patients  will require additional procedures depending on stone size and composition. I have also discussed with the patient that the success rate for ESWL is approximately 60-90% and depends on stone location and st

## 2022-01-02 ENCOUNTER — Ambulatory Visit (HOSPITAL_COMMUNITY): Payer: Medicare Other

## 2022-01-02 ENCOUNTER — Encounter (HOSPITAL_BASED_OUTPATIENT_CLINIC_OR_DEPARTMENT_OTHER): Payer: Self-pay | Admitting: Urology

## 2022-01-02 ENCOUNTER — Other Ambulatory Visit: Payer: Self-pay

## 2022-01-02 ENCOUNTER — Encounter (HOSPITAL_BASED_OUTPATIENT_CLINIC_OR_DEPARTMENT_OTHER)
Admission: RE | Disposition: A | Discharge status: Home / Self Care | Payer: Self-pay | Source: Home / Self Care | Attending: Urology

## 2022-01-02 ENCOUNTER — Ambulatory Visit (HOSPITAL_BASED_OUTPATIENT_CLINIC_OR_DEPARTMENT_OTHER)
Admission: RE | Admit: 2022-01-02 | Discharge: 2022-01-02 | Disposition: A | Discharge status: Home / Self Care | Payer: Medicare Other | Attending: Urology | Admitting: Urology

## 2022-01-02 DIAGNOSIS — Z87891 Personal history of nicotine dependence: Secondary | ICD-10-CM | POA: Insufficient documentation

## 2022-01-02 DIAGNOSIS — N201 Calculus of ureter: Secondary | ICD-10-CM

## 2022-01-02 DIAGNOSIS — N132 Hydronephrosis with renal and ureteral calculous obstruction: Secondary | ICD-10-CM | POA: Insufficient documentation

## 2022-01-02 HISTORY — DX: Multiple sclerosis: G35

## 2022-01-02 HISTORY — DX: Personal history of urinary calculi: Z87.442

## 2022-01-02 HISTORY — DX: Myoneural disorder, unspecified: G70.9

## 2022-01-02 HISTORY — DX: Hypothyroidism, unspecified: E03.9

## 2022-01-02 HISTORY — DX: Unspecified asthma, uncomplicated: J45.909

## 2022-01-02 HISTORY — PX: EXTRACORPOREAL SHOCK WAVE LITHOTRIPSY: SHX1557

## 2022-01-02 HISTORY — DX: Attention-deficit hyperactivity disorder, unspecified type: F90.9

## 2022-01-02 SURGERY — LITHOTRIPSY, ESWL
Anesthesia: LOCAL | Laterality: Right

## 2022-01-02 MED ORDER — DIAZEPAM 5 MG PO TABS
ORAL_TABLET | ORAL | Status: AC
  Filled 2022-01-02: qty 2

## 2022-01-02 MED ORDER — CIPROFLOXACIN HCL 500 MG PO TABS
ORAL_TABLET | ORAL | Status: AC
  Filled 2022-01-02: qty 1

## 2022-01-02 MED ORDER — CIPROFLOXACIN HCL 500 MG PO TABS
500.0000 mg | ORAL_TABLET | ORAL | Status: AC
  Administered 2022-01-02: 500 mg via ORAL

## 2022-01-02 MED ORDER — SODIUM CHLORIDE 0.9 % IV SOLN: INTRAVENOUS | Status: DC

## 2022-01-02 MED ORDER — DIAZEPAM 5 MG PO TABS
10.0000 mg | ORAL_TABLET | ORAL | Status: AC
  Administered 2022-01-02: 10 mg via ORAL

## 2022-01-02 MED ORDER — DIPHENHYDRAMINE HCL 25 MG PO CAPS
ORAL_CAPSULE | ORAL | Status: AC
  Filled 2022-01-02: qty 1

## 2022-01-02 MED ORDER — DIPHENHYDRAMINE HCL 25 MG PO CAPS
25.0000 mg | ORAL_CAPSULE | ORAL | Status: AC
  Administered 2022-01-02: 25 mg via ORAL

## 2022-01-02 NOTE — Interval H&P Note (Signed)
History and Physical Interval Note: ? ?01/02/2022 ?2:05 PM ? ?Amy Burgess  has presented today for surgery, with the diagnosis of RIGHT URETERAL CALCULUS.  The various methods of treatment have been discussed with the patient and family. After consideration of risks, benefits and other options for treatment, the patient has consented to  Procedure(s): ?EXTRACORPOREAL SHOCK WAVE LITHOTRIPSY (ESWL) (Right) as a surgical intervention.  The patient's history has been reviewed, patient examined, no change in status, stable for surgery.  I have reviewed the patient's chart and labs.  Questions were answered to the patient's satisfaction.   ? ? ?Amy Burgess ? ? ?

## 2022-01-02 NOTE — Discharge Instructions (Addendum)
1. You should strain your urine and collect all fragments and bring them to your follow up appointment.  2. You should take your pain medication as needed.  Please call if your pain is severe to the point that it is not controlled with your pain medication. 3. You should call if you develop fever > 101 or persistent nausea or vomiting. 4. Your doctor may prescribe tamsulosin to take to help facilitate stone passage.   Post Anesthesia Home Care Instructions  Activity: Get plenty of rest for the remainder of the day. A responsible individual must stay with you for 24 hours following the procedure.  For the next 24 hours, DO NOT: -Drive a car -Operate machinery -Drink alcoholic beverages -Take any medication unless instructed by your physician -Make any legal decisions or sign important papers.  Meals: Start with liquid foods such as gelatin or soup. Progress to regular foods as tolerated. Avoid greasy, spicy, heavy foods. If nausea and/or vomiting occur, drink only clear liquids until the nausea and/or vomiting subsides. Call your physician if vomiting continues.  

## 2022-01-02 NOTE — Op Note (Signed)
See [facility] operative note scanned into chart. Also because of the size, density, location and other factors that cannot be anticipated I feel this will likely be a staged procedure. This fact supersedes any indication in the scanned [facility] operative note to the contrary.  

## 2022-01-03 ENCOUNTER — Encounter (HOSPITAL_BASED_OUTPATIENT_CLINIC_OR_DEPARTMENT_OTHER): Payer: Self-pay | Admitting: Urology

## 2022-01-18 ENCOUNTER — Other Ambulatory Visit: Payer: Self-pay | Admitting: Neurology

## 2022-01-18 DIAGNOSIS — F988 Other specified behavioral and emotional disorders with onset usually occurring in childhood and adolescence: Secondary | ICD-10-CM

## 2022-01-20 ENCOUNTER — Other Ambulatory Visit: Payer: Self-pay | Admitting: Neurology

## 2022-01-20 DIAGNOSIS — F988 Other specified behavioral and emotional disorders with onset usually occurring in childhood and adolescence: Secondary | ICD-10-CM

## 2022-01-20 MED ORDER — ADDERALL XR 30 MG PO CP24: 30.0000 mg | ORAL_CAPSULE | Freq: Every day | ORAL | 0 refills | Status: DC

## 2022-01-20 MED ORDER — AMPHETAMINE-DEXTROAMPHET ER 30 MG PO CP24: 30.0000 mg | ORAL_CAPSULE | Freq: Every day | ORAL | 0 refills | Status: DC

## 2022-01-20 NOTE — Telephone Encounter (Signed)
Called pt back. [facility] and resent for brand name to [facility] instead. They have brand, not generic. She will pay OOP.  ?She has called multiple other pharmacies and a lot out of stock of adderall. Aware I will take care of this for her. She will call back if she has any more questions or concerns. ? ?[facility] and [facility] Tresa Endo. Cx rx adderall sent today.  ?

## 2022-01-20 NOTE — Telephone Encounter (Signed)
Last OV was on 1/99/23.  ?Next OV is scheduled for 04/22/22.  ?Last RX was written on 12/18/21 for 30 tabs.  ? ?[place] [facility] Database has been reviewed.  ?

## 2022-01-20 NOTE — Telephone Encounter (Signed)
Pt is requesting a refill for amphetamine-dextroamphetamine (ADDERALL XR) 30 MG 24 hr capsule. (Brand only is available) ? ?Pharmacy: [facility] [address] [phone] ? ?

## 2022-02-18 ENCOUNTER — Other Ambulatory Visit: Payer: Self-pay | Admitting: Neurology

## 2022-02-18 MED ORDER — ADDERALL XR 30 MG PO CP24: 30.0000 mg | ORAL_CAPSULE | Freq: Every day | ORAL | 0 refills | Status: DC

## 2022-03-19 ENCOUNTER — Other Ambulatory Visit: Payer: Self-pay | Admitting: Neurology

## 2022-03-19 DIAGNOSIS — F988 Other specified behavioral and emotional disorders with onset usually occurring in childhood and adolescence: Secondary | ICD-10-CM

## 2022-03-19 MED ORDER — AMPHETAMINE-DEXTROAMPHET ER 30 MG PO CP24: 30.0000 mg | ORAL_CAPSULE | Freq: Every day | ORAL | 0 refills | Status: DC

## 2022-03-19 NOTE — Telephone Encounter (Signed)
Last OV was on 10/21/21.  Next OV is scheduled for 04/22/22.  Last RX was written on 02/19/22 for 30 tabs.   [place] Drug Database has been reviewed.

## 2022-04-22 ENCOUNTER — Ambulatory Visit (INDEPENDENT_AMBULATORY_CARE_PROVIDER_SITE_OTHER): Payer: Medicare Other | Admitting: Neurology

## 2022-04-22 ENCOUNTER — Encounter: Payer: Self-pay | Admitting: Neurology

## 2022-04-22 VITALS — BP 134/77 | HR 70 | Ht 62.5 in | Wt 219.0 lb

## 2022-04-22 DIAGNOSIS — R269 Unspecified abnormalities of gait and mobility: Secondary | ICD-10-CM

## 2022-04-22 DIAGNOSIS — F32A Depression, unspecified: Secondary | ICD-10-CM

## 2022-04-22 DIAGNOSIS — F988 Other specified behavioral and emotional disorders with onset usually occurring in childhood and adolescence: Secondary | ICD-10-CM | POA: Diagnosis not present

## 2022-04-22 DIAGNOSIS — G35 Multiple sclerosis: Secondary | ICD-10-CM

## 2022-04-22 DIAGNOSIS — R3915 Urgency of urination: Secondary | ICD-10-CM

## 2022-04-22 MED ORDER — AMPHETAMINE-DEXTROAMPHET ER 30 MG PO CP24: 30.0000 mg | ORAL_CAPSULE | Freq: Every day | ORAL | 0 refills | Status: DC

## 2022-04-22 MED ORDER — SERTRALINE HCL 50 MG PO TABS: 50.0000 mg | ORAL_TABLET | Freq: Every day | ORAL | 3 refills | Status: DC

## 2022-04-22 NOTE — Progress Notes (Signed)
[facility]  PATIENT: Amy Burgess DOB: 1965/12/08  REFERRING DOCTOR OR PCP: Georganna Skeans, MD SOURCE: Patient, notes from primary care,  _________________________________   HISTORICAL  CHIEF COMPLAINT:  Chief Complaint  Patient presents with   Follow-up    RM 1, alone. Here for 6 month MS f/u, off DMT. MS stable. No new sx.     HISTORY OF PRESENT ILLNESS:  Amy Burgess is a 56 y.o. woman who was diagnosed with multiple sclerosis in 2015.     Update 04/22/2022: Since last visit, she had MRI of the brain and spine.  The spinal cord was normal.  The brain shows a small number of T2/FLAIR hyperintense foci.  Most were nonspecific, 1 was periventricular.  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  She has fluctuating painful dysesthesias in her feet.  Gait is sometimes off.   She has had 2 falls the last couple months due to balance.   She uses the bannister on stairs  She uses a cane intermittently.   She notes weakness in legs and numbness in feet with some pain.    She takes tizanidine up to 4 mg po tid (varies dose) for spasticity.  She is on gabapentin 400 mg po tid to qid prn      She has some urinary incontinence and uses pads.   Ditropan dried her out so she prefers not to be on one.   Vision is mildly off.  She has fatigue, helped by Adderall.    She sleeps well at night.   She does not snore.  She notes cognitive issues with brain fog and poor focus.    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     Nuvigil was less effective.      She is on Adderall XR 30 mg po qd.    She notes some depression and irritability and feels gabapentin may worsen it.   Also worse since the move up here as does not know a lot of people.         History of MS diagnosis: She is a 56 year old woman who was diagnosed with MS in 2005.   She had balance issues and slurred speech.  She had a positive FH of  MS (her paternal grandmother).    She was placed on Avonex but developed antibodies.    She saw Dr. Dr. Araceli Bouche in [place], [place]. MRi was reportedly c/w MS.  She also had a lumbar puncture.   She was in the [facility] trial (Dr. Araceli Bouche) and had treatments in 2009, 2010 and 2018.   She had frequent  MRI as part of the trial and reports she had progression.Marland Kitchen   She was on Gilenya briefly in 2017.   Due to new onset vertigo, she had an MRI and was told she had no new lesions.      Her last brain MRI was performed 01/02/2021 in [place].  She was able to pull up the results on her phone.  We do not have the images.  It was read as showing tiny infarcts in cerebellum and non-specific white matter changes but no acute findings.   It was performed at [facility]  in [place], [place]    (Phone [phone] / Fax [phone]).  We will try to get the images.  She is a retired Film/video editor.     IMAGING: MRI of the brain 10/30/2021 showed  several small T2/FLAIR hyperintense foci in the hemispheres.  These were nonspecific but could be consistent with demyelination or chronic microvascular ischemic change..  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  MRI of the thoracic spine 11/02/2021 showed a normal spinal cord and no significant degenerative changes  REVIEW OF SYSTEMS: Constitutional: No fevers, chills, sweats, or change in appetite.  She has fatigue. Eyes: No visual changes, double vision, eye pain Ear, nose and throat: No hearing loss, ear pain, nasal congestion, sore throat Cardiovascular: No chest pain, palpitations Respiratory:  No shortness of breath at rest or with exertion.   No wheezes GastrointestinaI: No nausea, vomiting, diarrhea, abdominal pain, fecal incontinence Genitourinary:  No dysuria,  urinary retention or frequency.  No nocturia. Musculoskeletal:  No neck pain, back pain Integumentary: No rash, pruritus, skin lesions Neurological: as above Psychiatric: No depression at this time.  No anxiety Endocrine: No palpitations, diaphoresis, change in appetite, change in weigh or increased thirst Hematologic/Lymphatic:  No anemia, purpura, petechiae. Allergic/Immunologic: No itchy/runny eyes, nasal congestion, recent allergic reactions, rashes  ALLERGIES: Allergies  Allergen Reactions   Iodinated Contrast Media Other (See Comments)    HOME MEDICATIONS:  Current Outpatient Medications:    acyclovir (ZOVIRAX) 400 MG tablet, Take 1 tablet (400 mg total) by mouth daily., Disp: 90 tablet, Rfl: 0   ADDERALL XR 30 MG 24 hr capsule, Take 1 capsule (30 mg total) by mouth daily., Disp: 30 capsule, Rfl: 0   amphetamine-dextroamphetamine (ADDERALL XR) 30 MG 24 hr capsule, Take 1 capsule (30 mg total) by mouth daily., Disp: 30 capsule, Rfl: 0   Cetirizine HCl (ZYRTEC ALLERGY) 10 MG CAPS, Zyrtec, Disp: , Rfl:    estradiol (VIVELLE-DOT) 0.1 MG/24HR patch, estradiol 0.1 mg/24 hr semiweekly transdermal patch  APPLY 1 PATCHED TOPICALLY TWICE WEEKLY, Disp: 8 patch, Rfl: 2   fluticasone (FLONASE) 50 MCG/ACT nasal spray, fluticasone propionate 50 mcg/actuation nasal spray,suspension  USE 1 SPRAY NASALLY IN EACH NOSTRIL ONCE DAILY, Disp: , Rfl:    gabapentin (NEURONTIN) 800 MG tablet, Take 1 tablet (800 mg total) by mouth 2 (two) times daily., Disp: 180 tablet, Rfl: 3   levothyroxine (SYNTHROID) 150 MCG tablet, Take 1 tablet (150 mcg total) by mouth daily., Disp: 90 tablet, Rfl: 0   levothyroxine (SYNTHROID) 175 MCG tablet, Take 1 tablet (175 mcg total) by mouth every morning., Disp: 90 tablet, Rfl: 0   ondansetron (ZOFRAN) 4 MG tablet, Take 4 mg by mouth every 8 (eight) hours as needed for nausea or vomiting., Disp: , Rfl:    oxyCODONE-acetaminophen (PERCOCET/ROXICET) 5-325 MG tablet, Take 1 tablet  by mouth every 6 (six) hours as needed for severe pain., Disp: , Rfl:    sertraline (ZOLOFT) 50 MG tablet, Take 1 tablet (50 mg total) by mouth daily., Disp: 90 tablet, Rfl: 3   tamsulosin (FLOMAX) 0.4 MG CAPS capsule, Take 0.4 mg by mouth., Disp: , Rfl:    tiZANidine (ZANAFLEX) 4 MG tablet, tizanidine 4 mg tablet TAKE 1 TABLET BY MOUTH THREE TIMES A DAY AS NEEDED, Disp: 270 tablet, Rfl: 3  PAST MEDICAL HISTORY: Past Medical History:  Diagnosis Date   ADHD (attention deficit hyperactivity disorder)    Asthma    History of kidney stones    Hypothyroidism    Multiple sclerosis (HCC)    Neuromuscular disorder (HCC)  PAST SURGICAL HISTORY: Past Surgical History:  Procedure Laterality Date   ABDOMINAL HYSTERECTOMY     APPENDECTOMY     CESAREAN SECTION     CHOLECYSTECTOMY     EXTRACORPOREAL SHOCK WAVE LITHOTRIPSY Right 01/02/2022   Procedure: EXTRACORPOREAL SHOCK WAVE LITHOTRIPSY (ESWL);  Surgeon: Heloise Purpura, MD;  [facility];  Service: Urology;  Laterality: Right;    FAMILY HISTORY: Family History  Problem Relation Age of Onset   Heart attack Mother    Hypertension Father    Diabetes type II Father    Multiple sclerosis Paternal Grandmother     SOCIAL HISTORY:  Social History   Socioeconomic History   Marital status: Divorced    Spouse name: Not on file   Number of children: Not on file   Years of education: Not on file   Highest education level: Not on file  Occupational History   Not on file  Tobacco Use   Smoking status: Former    Types: Cigarettes    Quit date: 1985    Years since quitting: 38.5   Smokeless tobacco: Never  Substance and Sexual Activity   Alcohol use: Yes    Comment: socaially   Drug use: Never   Sexual activity: Not on file  Other Topics Concern   Not on file  Social History Narrative   Not on file   Social Determinants of Health   Financial Resource Strain: Not on file  Food Insecurity: Not on file   Transportation Needs: Not on file  Physical Activity: Not on file  Stress: Not on file  Social Connections: Not on file  Intimate Partner Violence: Not on file     PHYSICAL EXAM  Vitals:   04/22/22 0828  BP: 134/77  Pulse: 70  Weight: 219 lb (99.3 kg)  Height: 5' 2.5" (1.588 m)    Body mass index is 39.42 kg/m.   General: The patient is well-developed and well-nourished and in no acute distress  HEENT:  Head is [place]/AT.  Sclera are anicteric.     Skin: Extremities are without rash or  edema.   Neurologic Exam  Mental status: The patient is alert and oriented x 3 at the time of the examination. The patient has apparent normal recent and remote memory, with an apparently normal attention span and concentration ability.   Speech is normal.  Cranial nerves: Extraocular movements are full. Pupils are equal, round, and reactive to light and accomodation.  Facial strength and sensation was normal..  Trapezius and sternocleidomastoid strength is normal. No dysarthria is noted.   No obvious hearing deficits are noted.  Motor:  Muscle bulk is normal.   Tone is mildly inc in right leg. Strength is  5 / 5 in all 4 extremities except 4+/5 right iliopsoas.   Sensory: Sensory testing is intact to pinprick, soft touch and vibration sensation in amrs.   She reports reduced right leg vibration sensation.    Coordination: Cerebellar testing reveals good finger-nose-finger and heel-to-shin bilaterally.  Gait and station: Station is normal.   The gait has a slight limp.  The tandem gait is wide.   Tandem gait is normal. Romberg is borderline.   Reflexes: Deep tendon reflexes are symmetric and normal in arms and mildly increased right ankle.          ASSESSMENT AND PLAN  Multiple sclerosis (HCC)  Gait disturbance  Attention deficit disorder (ADD) without hyperactivity  Urinary urgency  Depression, unspecified depression type   She  has a low lesion burden and no recent activty  (relapse of MRI).  She was on alemtuzumab and I will keep her off a DMT at this time.   We will wnt to recheck and MRI in late 2024 or early 2025. Continue Adderall.   Add sertralie. Rtc 6 months   sooner I new or worsening neurologic issues   Frankie Zito A. Felecia Shelling, MD, [facility] [id number], 0000000 AM Certified in Neurology, Clinical Neurophysiology, Sleep Medicine and Neuroimaging  [facility] [address] [place], [place] [address] [phone]

## 2022-04-24 ENCOUNTER — Other Ambulatory Visit: Payer: Self-pay | Admitting: Family Medicine

## 2022-04-24 DIAGNOSIS — Z8619 Personal history of other infectious and parasitic diseases: Secondary | ICD-10-CM

## 2022-04-25 ENCOUNTER — Other Ambulatory Visit: Payer: Self-pay | Admitting: Family Medicine

## 2022-04-25 NOTE — Telephone Encounter (Signed)
Requested Prescriptions  Pending Prescriptions Disp Refills  . levothyroxine (SYNTHROID) 150 MCG tablet [Pharmacy Med Name: LEVOTHYROXINE 150 MCG TABLET] 90 tablet 0    Sig: TAKE 1 TABLET BY MOUTH EVERY DAY     Endocrinology:  Hypothyroid Agents Passed - 04/25/2022  9:56 AM      Passed - TSH in normal range and within 360 days    TSH  Date Value Ref Range Status  12/09/2021 1.110 0.450 - 4.500 uIU/mL Final         Passed - Valid encounter within last 12 months    Recent Outpatient Visits          4 months ago Annual physical exam   Primary Care at [facility], Lauris Poag, MD   7 months ago Hypothyroidism, unspecified type   Primary Care at [facility] MD      Future Appointments            In 3 weeks Georganna Skeans, MD Primary Care at [facility]

## 2022-05-20 ENCOUNTER — Ambulatory Visit (INDEPENDENT_AMBULATORY_CARE_PROVIDER_SITE_OTHER): Payer: Medicare Other | Admitting: Family Medicine

## 2022-05-20 ENCOUNTER — Encounter: Payer: Self-pay | Admitting: Family Medicine

## 2022-05-20 VITALS — BP 121/68 | HR 93 | Temp 97.7°F | Resp 16 | Wt 218.6 lb

## 2022-05-20 DIAGNOSIS — F32A Depression, unspecified: Secondary | ICD-10-CM | POA: Diagnosis not present

## 2022-05-20 DIAGNOSIS — E039 Hypothyroidism, unspecified: Secondary | ICD-10-CM

## 2022-05-20 DIAGNOSIS — E785 Hyperlipidemia, unspecified: Secondary | ICD-10-CM | POA: Diagnosis not present

## 2022-05-20 NOTE — Progress Notes (Signed)
Established Patient Office Visit  Subjective    Patient ID: Amy Burgess, female    DOB: 07-19-1966  Age: 56 y.o. MRN: [id number]  CC:  Chief Complaint  Patient presents with   Follow-up    HPI Amy Burgess presents for follow up of chronic med issues including hypothyroidism. She denies acute complaints or concerns.    Outpatient Encounter Medications as of 05/20/2022  Medication Sig   acyclovir (ZOVIRAX) 400 MG tablet TAKE 1 TABLET BY MOUTH EVERY DAY   amphetamine-dextroamphetamine (ADDERALL XR) 30 MG 24 hr capsule Take 1 capsule (30 mg total) by mouth daily.   Cetirizine HCl (ZYRTEC ALLERGY) 10 MG CAPS Zyrtec   estradiol (VIVELLE-DOT) 0.1 MG/24HR patch estradiol 0.1 mg/24 hr semiweekly transdermal patch  APPLY 1 PATCHED TOPICALLY TWICE WEEKLY   fluticasone (FLONASE) 50 MCG/ACT nasal spray fluticasone propionate 50 mcg/actuation nasal spray,suspension  USE 1 SPRAY NASALLY IN EACH NOSTRIL ONCE DAILY   gabapentin (NEURONTIN) 800 MG tablet Take 1 tablet (800 mg total) by mouth 2 (two) times daily.   levothyroxine (SYNTHROID) 150 MCG tablet TAKE 1 TABLET BY MOUTH EVERY DAY   ondansetron (ZOFRAN) 4 MG tablet Take 4 mg by mouth every 8 (eight) hours as needed for nausea or vomiting.   oxyCODONE-acetaminophen (PERCOCET/ROXICET) 5-325 MG tablet Take 1 tablet by mouth every 6 (six) hours as needed for severe pain.   sertraline (ZOLOFT) 50 MG tablet Take 1 tablet (50 mg total) by mouth daily.   tamsulosin (FLOMAX) 0.4 MG CAPS capsule Take 0.4 mg by mouth.   tiZANidine (ZANAFLEX) 4 MG tablet tizanidine 4 mg tablet TAKE 1 TABLET BY MOUTH THREE TIMES A DAY AS NEEDED   [DISCONTINUED] ADDERALL XR 30 MG 24 hr capsule Take 1 capsule (30 mg total) by mouth daily.   [DISCONTINUED] levothyroxine (SYNTHROID) 175 MCG tablet Take 1 tablet (175 mcg total) by mouth every morning.   No facility-administered encounter medications on file as of 05/20/2022.    Past Medical History:  Diagnosis  Date   ADHD (attention deficit hyperactivity disorder)    Asthma    History of kidney stones    Hypothyroidism    Multiple sclerosis (HCC)    Neuromuscular disorder (HCC)     Past Surgical History:  Procedure Laterality Date   ABDOMINAL HYSTERECTOMY     APPENDECTOMY     CESAREAN SECTION     CHOLECYSTECTOMY     EXTRACORPOREAL SHOCK WAVE LITHOTRIPSY Right 01/02/2022   Procedure: EXTRACORPOREAL SHOCK WAVE LITHOTRIPSY (ESWL);  Surgeon: Heloise Purpura, MD;  Location: [facility];  Service: Urology;  Laterality: Right;    Family History  Problem Relation Age of Onset   Heart attack Mother    Hypertension Father    Diabetes type II Father    Multiple sclerosis Paternal Grandmother     Social History   Socioeconomic History   Marital status: Divorced    Spouse name: Not on file   Number of children: Not on file   Years of education: Not on file   Highest education level: Not on file  Occupational History   Not on file  Tobacco Use   Smoking status: Former    Types: Cigarettes    Quit date: 2    Years since quitting: 38.6   Smokeless tobacco: Never  Substance and Sexual Activity   Alcohol use: Yes    Comment: socaially   Drug use: Never   Sexual activity: Not on file  Other Topics Concern  Not on file  Social History Narrative   Not on file   Social Determinants of Health   Financial Resource Strain: Not on file  Food Insecurity: Not on file  Transportation Needs: Not on file  Physical Activity: Not on file  Stress: Not on file  Social Connections: Not on file  Intimate Partner Violence: Not on file    Review of Systems  All other systems reviewed and are negative.       Objective    BP 121/68   Pulse 93   Temp 97.7 F (36.5 C) (Oral)   Resp 16   Wt 218 lb 9.6 oz (99.2 kg)   SpO2 97%   BMI 39.35 kg/m   Physical Exam Vitals and nursing note reviewed.  Constitutional:      General: She is not in acute distress. Neck:      Thyroid: No thyromegaly.  Cardiovascular:     Rate and Rhythm: Normal rate and regular rhythm.  Pulmonary:     Effort: Pulmonary effort is normal.     Breath sounds: Normal breath sounds.  Abdominal:     Palpations: Abdomen is soft.     Tenderness: There is no abdominal tenderness.  Musculoskeletal:     Cervical back: Normal range of motion and neck supple.  Neurological:     General: No focal deficit present.     Mental Status: She is alert and oriented to person, place, and time.  Psychiatric:        Mood and Affect: Mood normal.        Behavior: Behavior normal.         Assessment & Plan:   1. Hypothyroidism, unspecified type Monitoring labs ordered. Continue present management - TSH; Future - T4, Free; Future  2. Hyperlipidemia, unspecified hyperlipidemia type Monitoring labs ordered. Continue present management - Lipid Panel; Future  3. Depression, unspecified depression type Appears stable with present management. Recently placed on agent by consultant. Management per consultant.     No follow-ups on file.   Tommie Raymond, MD

## 2022-05-28 ENCOUNTER — Other Ambulatory Visit: Payer: Self-pay | Admitting: Neurology

## 2022-05-28 DIAGNOSIS — F988 Other specified behavioral and emotional disorders with onset usually occurring in childhood and adolescence: Secondary | ICD-10-CM

## 2022-05-29 MED ORDER — AMPHETAMINE-DEXTROAMPHET ER 30 MG PO CP24: 30.0000 mg | ORAL_CAPSULE | Freq: Every day | ORAL | 0 refills | Status: DC

## 2022-05-29 NOTE — Telephone Encounter (Signed)
Last OV was on 04/22/22.  Next OV is scheduled for 10/23/22.  Last RX was written on 04/22/22 for 30 tabs.   [place] [facility] Database has been reviewed.

## 2022-06-04 ENCOUNTER — Other Ambulatory Visit: Payer: Medicare Other

## 2022-06-04 DIAGNOSIS — E039 Hypothyroidism, unspecified: Secondary | ICD-10-CM

## 2022-06-04 DIAGNOSIS — E785 Hyperlipidemia, unspecified: Secondary | ICD-10-CM

## 2022-06-05 LAB — T4, FREE: Free T4: 1.34 ng/dL (ref 0.82–1.77)

## 2022-06-05 LAB — LIPID PANEL
Chol/HDL Ratio: 3.6 ratio (ref 0.0–4.4)
Cholesterol, Total: 217 mg/dL — ABNORMAL HIGH (ref 100–199)
HDL: 60 mg/dL (ref >39)
LDL Chol Calc (NIH): 139 mg/dL — ABNORMAL HIGH (ref 0–99)
Triglycerides: 102 mg/dL (ref 0–149)
VLDL Cholesterol Cal: 18 mg/dL (ref 5–40)

## 2022-06-05 LAB — TSH: TSH: 6.57 u[IU]/mL — ABNORMAL HIGH (ref 0.450–4.500)

## 2022-06-06 ENCOUNTER — Other Ambulatory Visit: Payer: Self-pay | Admitting: Family Medicine

## 2022-06-06 ENCOUNTER — Ambulatory Visit: Payer: Self-pay | Admitting: *Deleted

## 2022-06-06 NOTE — Telephone Encounter (Signed)
Summary: dx covid yesterday, tired, achy low fever, congestion   Patient called in dx with covid yesterday, she has acheness, tired, low grade ferver, congestion. She is asking for Plaxlovid to be sent to pharmacy.    [id number] [place], [place] - [address]  Phone: [phone]  Fax: [phone]        Chief Complaint: positive covid test today, requesting antiviral medication Symptoms: tired, achy, low grade fever, congestion Frequency: sx started yesterday  Pertinent Negatives: Patient denies chest pain, no difficulty breahting Disposition: [] ED /[x] Urgent Care (no appt availability in office) / [] Appointment(In office/virtual)/ []  [place] [facility]/ [] Home Care/ [] Refused Recommended Disposition /[] [place] [facility]/ []  Follow-up with PCP Additional Notes:   Patient requesting albuterol inhaler in case breathing becomes affected over weekend. No available appt until next week. Please advise . Hx MS      Reason for Disposition  [1] HIGH RISK patient (e.g., weak immune system, age > 64 years, obesity with BMI 30 or higher, pregnant, chronic lung disease or other chronic medical condition) AND [2] COVID symptoms (e.g., cough, fever)  (Exceptions: Already seen by PCP and no new or worsening symptoms.)  Answer Assessment - Initial Assessment Questions 1. COVID-19 DIAGNOSIS: "How do you know that you have COVID?" (e.g., positive lab test or self-test, diagnosed by doctor or NP/PA, symptoms after exposure).     At home test positive covid  2. COVID-19 EXPOSURE: "Was there any known exposure to COVID before the symptoms began?" CDC Definition of close contact: within 6 feet (2 meters) for a total of 15 minutes or more over a 24-hour period.      na 3. ONSET: "When did the COVID-19 symptoms start?"      Yesterday  4. WORST SYMPTOM: "What is your worst symptom?" (e.g., cough, fever, shortness of breath, muscle aches)     Tired, achy, fever low grade,  congestion  5. COUGH: "Do you have a cough?" If Yes, ask: "How bad is the cough?"       na 6. FEVER: "Do you have a fever?" If Yes, ask: "What is your temperature, how was it measured, and when did it start?"     yes 7. RESPIRATORY STATUS: "Describe your breathing?" (e.g., normal; shortness of breath, wheezing, unable to speak)      Denies shortness of breath . Has had wheezing in the past  8. BETTER-SAME-WORSE: "Are you getting better, staying the same or getting worse compared to yesterday?"  If getting worse, ask, "In what way?"     na 9. OTHER SYMPTOMS: "Do you have any other symptoms?"  (e.g., chills, fatigue, headache, loss of smell or taste, muscle pain, sore throat)     Tired, achy, congestion fever 10. HIGH RISK DISEASE: "Do you have any chronic medical problems?" (e.g., asthma, heart or lung disease, weak immune system, obesity, etc.)       Hx MS 11. VACCINE: "Have you had the COVID-19 vaccine?" If Yes, ask: "Which one, how many shots, when did you get it?"       Yes  12. PREGNANCY: "Is there any chance you are pregnant?" "When was your last menstrual period?"       na 13. O2 SATURATION MONITOR:  "Do you use an oxygen saturation monitor (pulse oximeter) at home?" If Yes, ask "What is your reading (oxygen level) today?" "What is your usual oxygen saturation reading?" (e.g., 95%)       na  Protocols used: Coronavirus (COVID-19) Diagnosed  or Suspected-A-AH

## 2022-06-06 NOTE — Telephone Encounter (Signed)
Please advise patient.  

## 2022-06-09 ENCOUNTER — Encounter: Payer: Self-pay | Admitting: Family Medicine

## 2022-06-09 ENCOUNTER — Telehealth: Payer: Self-pay | Admitting: Family Medicine

## 2022-06-09 ENCOUNTER — Other Ambulatory Visit: Payer: Self-pay | Admitting: Family Medicine

## 2022-06-09 MED ORDER — ALBUTEROL SULFATE HFA 108 (90 BASE) MCG/ACT IN AERS: 2.0000 | INHALATION_SPRAY | Freq: Four times a day (QID) | RESPIRATORY_TRACT | 0 refills | Status: DC | PRN

## 2022-06-09 NOTE — Telephone Encounter (Signed)
Patient is aware of needing GFR for paxlovid. Patient agree to get inhaler to help with breathing

## 2022-06-09 NOTE — Telephone Encounter (Signed)
Patient is requesting medication  

## 2022-06-09 NOTE — Telephone Encounter (Signed)
PT  calling regarding covid meds and needing medication still.Please advise and thank you.

## 2022-06-09 NOTE — Telephone Encounter (Signed)
Patient was called and informed that we need a GFR result on her. We do  not have it. Patient request inhaler to be sent in to help her breathe

## 2022-06-09 NOTE — Telephone Encounter (Signed)
PT has called as positive covid on Friday, Triage, notes forwarded to Dr but still open encounter, office said would relay message as soon as Dr Lacretia Nicks was free, advised to also tell pt to MyChart Dr Andrey Campanile,  Pt declined to message as did not want to inundate Dr Andrey Campanile further. Pls make sure covid med called in as pt is now on 5th day of positive Covid.

## 2022-06-20 ENCOUNTER — Other Ambulatory Visit: Payer: Self-pay | Admitting: *Deleted

## 2022-06-20 DIAGNOSIS — R7989 Other specified abnormal findings of blood chemistry: Secondary | ICD-10-CM

## 2022-06-29 ENCOUNTER — Other Ambulatory Visit: Payer: Self-pay | Admitting: Neurology

## 2022-06-29 DIAGNOSIS — F988 Other specified behavioral and emotional disorders with onset usually occurring in childhood and adolescence: Secondary | ICD-10-CM

## 2022-06-30 ENCOUNTER — Ambulatory Visit: Payer: Self-pay | Admitting: *Deleted

## 2022-06-30 MED ORDER — AMPHETAMINE-DEXTROAMPHET ER 30 MG PO CP24: 30.0000 mg | ORAL_CAPSULE | Freq: Every day | ORAL | 0 refills | Status: DC

## 2022-06-30 NOTE — Telephone Encounter (Signed)
  Chief Complaint: SOB Symptoms: Pt reports has been ill since covid 06/06/22. Had sinus infection post covid. Albuterol inhaler "Not helping much." SOB with minimal exertion. Fatigued. Right sided pain with deep breathing. Temp 90.0 to 99.0 max. O2 sats 95 and up.  Frequency: 06/21/22, 2 weeks Pertinent Negatives: Patient denies  Disposition: [] ED /[x] Urgent Care (no appt availability in office) / [] Appointment(In office/virtual)/ []  [place] [facility]/ [] Home Care/ [] Refused Recommended Disposition /[] [place] [facility]/ []  Follow-up with PCP Additional Notes: Pt asking to come in for blood work and CXR, no availability. Advised UC. States will follow disposition, "Can't go until tomorrow but I will." Advised to go today, states she cannot. Assured pt NT would route to practice for PCPs review. Advised ED for any worsening symptoms. Pt verbalizes understanding. Reason for Disposition  [1] Longstanding difficulty breathing (e.g., CHF, COPD, emphysema) AND [2] WORSE than normal  Answer Assessment - Initial Assessment Questions 1. RESPIRATORY STATUS: "Describe your breathing?" (e.g., wheezing, shortness of breath, unable to speak, severe coughing)      SOB with minimal exertion 2. ONSET: "When did this breathing problem begin?"      Worsening 06/21/22 3. PATTERN "Does the difficult breathing come and go, or has it been constant since it started?"      Constant 4. SEVERITY: "How bad is your breathing?" (e.g., mild, moderate, severe)    - MILD: No SOB at rest, mild SOB with walking, speaks normally in sentences, can lie down, no retractions, pulse < 100.    - MODERATE: SOB at rest, SOB with minimal exertion and prefers to sit, cannot lie down flat, speaks in phrases, mild retractions, audible wheezing, pulse 100-120.    - SEVERE: Very SOB at rest, speaks in single words, struggling to breathe, sitting hunched forward, retractions, pulse > 120       5. RECURRENT SYMPTOM: "Have you had  difficulty breathing before?" If Yes, ask: "When was the last time?" and "What happened that time?"       6. CARDIAC HISTORY: "Do you have any history of heart disease?" (e.g., heart attack, angina, bypass surgery, angioplasty)       7. LUNG HISTORY: "Do you have any history of lung disease?"  (e.g., pulmonary embolus, asthma, emphysema)      8. CAUSE: "What do you think is causing the breathing problem?"       9. OTHER SYMPTOMS: "Do you have any other symptoms? (e.g., dizziness, runny nose, cough, chest pain, fever)     Extreme fatigue, Fever 90.0 to 99.9, "Right sided pain around lung." Pulse OX 95% and above.  Protocols used: Breathing Difficulty-A-AH

## 2022-07-01 ENCOUNTER — Ambulatory Visit (INDEPENDENT_AMBULATORY_CARE_PROVIDER_SITE_OTHER): Payer: Medicare Other

## 2022-07-01 ENCOUNTER — Ambulatory Visit
Admission: RE | Admit: 2022-07-01 | Discharge: 2022-07-01 | Disposition: A | Discharge status: Home / Self Care | Payer: Medicare Other | Source: Ambulatory Visit | Attending: Internal Medicine | Admitting: Internal Medicine

## 2022-07-01 VITALS — BP 118/63 | HR 81 | Temp 98.0°F

## 2022-07-01 DIAGNOSIS — R0602 Shortness of breath: Secondary | ICD-10-CM | POA: Diagnosis not present

## 2022-07-01 DIAGNOSIS — R053 Chronic cough: Secondary | ICD-10-CM | POA: Diagnosis not present

## 2022-07-01 MED ORDER — PREDNISONE 10 MG (21) PO TBPK: ORAL_TABLET | Freq: Every day | ORAL | 0 refills | Status: DC

## 2022-07-01 NOTE — ED Provider Notes (Signed)
[facility]    CSN: [id number] Arrival date & time: 07/01/22  2947      History   Chief Complaint Chief Complaint  Patient presents with   Cough    Had covid then a sunus infection. Still feeling unwell with a cough and ride side of chest discomfort. Spoke with Dr Tawana Scale office and suggested that I make an urgent care appointment. - Entered by patient    HPI Amy Burgess is a 56 y.o. female.   Patient presents for further evaluation for shortness of breath that is intermittent, fatigue, nonproductive dry cough.  Patient tested positive for COVID-19 at the end of August.  Patient has been having persistent symptoms ever since.  She was not able to take Paxlovid given that she did not have a recent GFR.  She had an E-visit recently and was prescribed Augmentin for "sinus infection" which she is still currently taking.  Shortness of breath is intermittent and is typically with talking a lot and with exertion.  Patient also reports that she has an intermittent sharp chest pain on the right side of her chest that started a few days prior.  Denies left-sided chest pain.  Patient reports that she has minimal nasal congestion since starting Augmentin.  Tmax at home was 99.  Denies history of asthma or COPD.  She was prescribed albuterol inhaler by PCP as well which has been minimally helpful.   Cough   Past Medical History:  Diagnosis Date   ADHD (attention deficit hyperactivity disorder)    Asthma    History of kidney stones    Hypothyroidism    Multiple sclerosis (HCC)    Neuromuscular disorder [facility])     Patient Active Problem List   Diagnosis Date Noted   Multiple sclerosis (HCC) 04/22/2022   Gait disturbance 04/22/2022   Attention deficit disorder (ADD) without hyperactivity 04/22/2022   Urinary urgency 04/22/2022   Depression 04/22/2022    Past Surgical History:  Procedure Laterality Date   ABDOMINAL HYSTERECTOMY     APPENDECTOMY     CESAREAN  SECTION     CHOLECYSTECTOMY     EXTRACORPOREAL SHOCK WAVE LITHOTRIPSY Right 01/02/2022   Procedure: EXTRACORPOREAL SHOCK WAVE LITHOTRIPSY (ESWL);  Surgeon: Heloise Purpura, MD;  Location: [facility];  Service: Urology;  Laterality: Right;    OB History   No obstetric history on file.      Home Medications    Prior to Admission medications   Medication Sig Start Date End Date Taking? Authorizing Provider  predniSONE (STERAPRED UNI-PAK 21 TAB) 10 MG (21) TBPK tablet Take by mouth daily. Take 6 tabs by mouth daily  for 2 days, then 5 tabs for 2 days, then 4 tabs for 2 days, then 3 tabs for 2 days, 2 tabs for 2 days, then 1 tab by mouth daily for 2 days 07/01/22  Yes Nandi Tonnesen, Tintah E, FNP  acyclovir (ZOVIRAX) 400 MG tablet TAKE 1 TABLET BY MOUTH EVERY DAY 04/24/22   Georganna Skeans, MD  albuterol (VENTOLIN HFA) 108 (90 Base) MCG/ACT inhaler Inhale 2 puffs into the lungs every 6 (six) hours as needed for wheezing or shortness of breath. 06/09/22   Georganna Skeans, MD  amphetamine-dextroamphetamine (ADDERALL XR) 30 MG 24 hr capsule Take 1 capsule (30 mg total) by mouth daily. 06/30/22   Sater, Pearletha Furl, MD  Cetirizine HCl (ZYRTEC ALLERGY) 10 MG CAPS Zyrtec    [provider]  estradiol (VIVELLE-DOT) 0.1 MG/24HR patch estradiol 0.1 mg/24  hr semiweekly transdermal patch  APPLY 1 PATCHED TOPICALLY TWICE WEEKLY 09/09/21   Georganna Skeans, MD  fluticasone [facility]) 50 MCG/ACT nasal spray fluticasone propionate 50 mcg/actuation nasal spray,suspension  USE 1 SPRAY NASALLY IN EACH NOSTRIL ONCE DAILY    [provider]  gabapentin (NEURONTIN) 800 MG tablet Take 1 tablet (800 mg total) by mouth 2 (two) times daily. 10/21/21   Sater, Pearletha Furl, MD  levothyroxine (SYNTHROID) 150 MCG tablet TAKE 1 TABLET BY MOUTH EVERY DAY 04/25/22   Georganna Skeans, MD  ondansetron (ZOFRAN) 4 MG tablet Take 4 mg by mouth every 8 (eight) hours as needed for nausea or vomiting.    [provider]   oxyCODONE-acetaminophen (PERCOCET/ROXICET) 5-325 MG tablet Take 1 tablet by mouth every 6 (six) hours as needed for severe pain.    [provider]  sertraline (ZOLOFT) 50 MG tablet Take 1 tablet (50 mg total) by mouth daily. 04/22/22   Sater, Pearletha Furl, MD  tamsulosin (FLOMAX) 0.4 MG CAPS capsule Take 0.4 mg by mouth.    [provider]  tiZANidine (ZANAFLEX) 4 MG tablet tizanidine 4 mg tablet TAKE 1 TABLET BY MOUTH THREE TIMES A DAY AS NEEDED 10/21/21   Sater, Pearletha Furl, MD    Family History Family History  Problem Relation Age of Onset   Heart attack Mother    Hypertension Father    Diabetes type II Father    Multiple sclerosis Paternal Grandmother     Social History Social History   Tobacco Use   Smoking status: Former    Types: Cigarettes    Quit date: 1985    Years since quitting: 38.7   Smokeless tobacco: Never  Substance Use Topics   Alcohol use: Yes    Comment: socaially   Drug use: Never     Allergies   Iodinated contrast media   Review of Systems Review of Systems Per HPI  Physical Exam Triage Vital Signs ED Triage Vitals  Enc Vitals Group     BP 07/01/22 1028 118/63     Pulse Rate 07/01/22 1028 81     Resp --      Temp 07/01/22 1028 98 F (36.7 C)     Temp Source 07/01/22 1028 Oral     SpO2 07/01/22 1028 98 %     Weight --      Height --      Head Circumference --      Peak Flow --      Pain Score 07/01/22 1026 5     Pain Loc --      Pain Edu? --      Excl. in GC? --    No data found.  Updated Vital Signs BP 118/63 (BP Location: Left Arm)   Pulse 81   Temp 98 F (36.7 C) (Oral)   SpO2 98%   Visual Acuity Right Eye Distance:   Left Eye Distance:   Bilateral Distance:    Right Eye Near:   Left Eye Near:    Bilateral Near:     Physical Exam Constitutional:      General: She is not in acute distress.    Appearance: Normal appearance. She is not toxic-appearing or diaphoretic.  HENT:     Head: Normocephalic and  atraumatic.     Right Ear: Tympanic membrane and ear canal normal.     Left Ear: Tympanic membrane and ear canal normal.     Nose: Congestion present.     Mouth/Throat:  Mouth: Mucous membranes are moist.     Pharynx: No posterior oropharyngeal erythema.  Eyes:     Extraocular Movements: Extraocular movements intact.     Conjunctiva/sclera: Conjunctivae normal.     Pupils: Pupils are equal, round, and reactive to light.  Cardiovascular:     Rate and Rhythm: Normal rate and regular rhythm.     Pulses: Normal pulses.     Heart sounds: Normal heart sounds.  Pulmonary:     Effort: Pulmonary effort is normal. No respiratory distress.     Breath sounds: Normal breath sounds. No stridor. No wheezing, rhonchi or rales.  Abdominal:     General: Abdomen is flat. Bowel sounds are normal.     Palpations: Abdomen is soft.  Musculoskeletal:        General: Normal range of motion.     Cervical back: Normal range of motion.  Skin:    General: Skin is warm and dry.  Neurological:     General: No focal deficit present.     Mental Status: She is alert and oriented to person, place, and time. Mental status is at baseline.  Psychiatric:        Mood and Affect: Mood normal.        Behavior: Behavior normal.      UC Treatments / Results  Labs (all labs ordered are listed, but only abnormal results are displayed) Labs Reviewed - No data to display  EKG   Radiology DG Chest 2 View  Result Date: 07/01/2022 CLINICAL DATA:  Shortness of breath EXAM: CHEST - 2 VIEW COMPARISON:  None Available. FINDINGS: The heart size and mediastinal contours are within normal limits. Both lungs are clear. The visualized skeletal structures are unremarkable. IMPRESSION: No active cardiopulmonary disease. Electronically Signed   By: Lorenza Cambridge M.D.   On: 07/01/2022 11:07    Procedures Procedures (including critical care time)  Medications Ordered in UC Medications - No data to display  Initial  Impression / Assessment and Plan / UC Course  I have reviewed the triage vital signs and the nursing notes.  Pertinent labs & imaging results that were available during my care of the patient were reviewed by me and considered in my medical decision making (see chart for details).     Chest x-ray was negative for any acute cardiopulmonary process.  Patient's vital signs are stable and no signs of respiratory distress or tachypnea.  Do not think that any emergent valuation is necessary for patient.  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  I do think patient would benefit from prednisone steroid taper given persistent cough, fatigue, intermittent shortness of breath as albuterol inhaler and other medications are not being beneficial for patient.  Patient has taken prednisone multiple times before given history of MS and has tolerated well.  Patient does use estradiol patches and advised patient of concurrent use with this and prednisone as it can increase prednisone effectiveness.  Although, prednisone should still be safe.  Patient voiced understanding.  Patient was given strict return and ER precautions.  Patient advised to follow-up with PCP for further evaluation and management as well.  Patient verbalized understanding and was agreeable with plan. Final Clinical Impressions(s) / UC Diagnoses   Final diagnoses:  Persistent cough for 3 weeks or longer  Shortness of breath     Discharge Instructions      Chest x-ray was normal.  I have prescribed you prednisone which should help alleviate most  of your symptoms.  Please follow-up if symptoms persist or worsen.  Also recommend that you follow-up with primary care doctor if symptoms persist as you may need pulmonology referral and/or further work-up.     ED Prescriptions     Medication Sig Dispense Auth. Provider   predniSONE (STERAPRED UNI-PAK 21 TAB) 10 MG (21) TBPK tablet Take by mouth  daily. Take 6 tabs by mouth daily  for 2 days, then 5 tabs for 2 days, then 4 tabs for 2 days, then 3 tabs for 2 days, 2 tabs for 2 days, then 1 tab by mouth daily for 2 days 42 tablet [place], Michele Rockers, [place]      PDMP not reviewed this encounter.   Teodora Medici, [place] 07/01/22 1118

## 2022-07-01 NOTE — ED Triage Notes (Signed)
Pt c/o of increased fatigue, SOB and diaphoresis. States that she has alos had a fever  Onset- Friday

## 2022-07-01 NOTE — Discharge Instructions (Signed)
Chest x-ray was normal.  I have prescribed you prednisone which should help alleviate most of your symptoms.  Please follow-up if symptoms persist or worsen.  Also recommend that you follow-up with primary care doctor if symptoms persist as you may need pulmonology referral and/or further work-up.

## 2022-07-21 ENCOUNTER — Other Ambulatory Visit: Payer: Self-pay | Admitting: Family Medicine

## 2022-07-21 DIAGNOSIS — Z8619 Personal history of other infectious and parasitic diseases: Secondary | ICD-10-CM

## 2022-08-06 ENCOUNTER — Other Ambulatory Visit: Payer: Self-pay | Admitting: Family Medicine

## 2022-08-06 ENCOUNTER — Other Ambulatory Visit: Payer: Self-pay | Admitting: Neurology

## 2022-08-06 DIAGNOSIS — Z8619 Personal history of other infectious and parasitic diseases: Secondary | ICD-10-CM

## 2022-08-06 DIAGNOSIS — F988 Other specified behavioral and emotional disorders with onset usually occurring in childhood and adolescence: Secondary | ICD-10-CM

## 2022-08-06 MED ORDER — AMPHETAMINE-DEXTROAMPHET ER 30 MG PO CP24: 30.0000 mg | ORAL_CAPSULE | Freq: Every day | ORAL | 0 refills | Status: DC

## 2022-08-26 ENCOUNTER — Other Ambulatory Visit: Payer: Medicare Other

## 2022-08-26 DIAGNOSIS — E039 Hypothyroidism, unspecified: Secondary | ICD-10-CM

## 2022-08-26 DIAGNOSIS — R7989 Other specified abnormal findings of blood chemistry: Secondary | ICD-10-CM

## 2022-08-27 LAB — COMPREHENSIVE METABOLIC PANEL
ALT: 12 IU/L (ref 0–32)
AST: 14 IU/L (ref 0–40)
Albumin/Globulin Ratio: 1.6 (ref 1.2–2.2)
Albumin: 3.9 g/dL (ref 3.8–4.9)
Alkaline Phosphatase: 99 IU/L (ref 44–121)
BUN/Creatinine Ratio: 14 (ref 9–23)
BUN: 12 mg/dL (ref 6–24)
Bilirubin Total: 0.2 mg/dL (ref 0.0–1.2)
CO2: 21 mmol/L (ref 20–29)
Calcium: 9.3 mg/dL (ref 8.7–10.2)
Chloride: 105 mmol/L (ref 96–106)
Creatinine, Ser: 0.86 mg/dL (ref 0.57–1.00)
Globulin, Total: 2.4 g/dL (ref 1.5–4.5)
Glucose: 62 mg/dL — ABNORMAL LOW (ref 70–99)
Potassium: 3.8 mmol/L (ref 3.5–5.2)
Sodium: 142 mmol/L (ref 134–144)
Total Protein: 6.3 g/dL (ref 6.0–8.5)
eGFR: 79 mL/min/{1.73_m2} (ref >59)

## 2022-08-27 LAB — TSH+T4F+T3FREE
Free T4: 1.64 ng/dL (ref 0.82–1.77)
T3, Free: 2.5 pg/mL (ref 2.0–4.4)
TSH: 4.14 u[IU]/mL (ref 0.450–4.500)

## 2022-09-08 ENCOUNTER — Other Ambulatory Visit: Payer: Self-pay | Admitting: Neurology

## 2022-09-08 DIAGNOSIS — F988 Other specified behavioral and emotional disorders with onset usually occurring in childhood and adolescence: Secondary | ICD-10-CM

## 2022-09-08 MED ORDER — AMPHETAMINE-DEXTROAMPHET ER 30 MG PO CP24: 30.0000 mg | ORAL_CAPSULE | Freq: Every day | ORAL | 0 refills | Status: DC

## 2022-09-08 NOTE — Telephone Encounter (Signed)
Patient is due for a refill on adderall. Patient is up to date on her appointments. [place] Controlled Substance Registry checked and is appropriate.  

## 2022-09-22 ENCOUNTER — Other Ambulatory Visit: Payer: Self-pay | Admitting: Family Medicine

## 2022-09-22 DIAGNOSIS — Z1231 Encounter for screening mammogram for malignant neoplasm of breast: Secondary | ICD-10-CM

## 2022-09-23 ENCOUNTER — Other Ambulatory Visit: Payer: Self-pay | Admitting: Family Medicine

## 2022-09-23 DIAGNOSIS — Z1231 Encounter for screening mammogram for malignant neoplasm of breast: Secondary | ICD-10-CM

## 2022-10-16 ENCOUNTER — Other Ambulatory Visit: Payer: Self-pay | Admitting: Neurology

## 2022-10-16 DIAGNOSIS — F988 Other specified behavioral and emotional disorders with onset usually occurring in childhood and adolescence: Secondary | ICD-10-CM

## 2022-10-16 MED ORDER — AMPHETAMINE-DEXTROAMPHET ER 30 MG PO CP24: 30.0000 mg | ORAL_CAPSULE | Freq: Every day | ORAL | 0 refills | Status: DC

## 2022-10-22 NOTE — Progress Notes (Unsigned)
[facility]  PATIENT: Amy Burgess DOB: 1966-05-29  REFERRING DOCTOR OR PCP: Georganna Skeans, MD SOURCE: Patient, notes from primary care,  _________________________________   HISTORICAL  CHIEF COMPLAINT:  No chief complaint on file.   HISTORY OF PRESENT ILLNESS:  Amy Burgess is a 57 y.o. woman who was diagnosed with multiple sclerosis in 2015.     Update 04/22/2022: Returns for routine follow-up.  Prior visit 04/22/2022 with Dr. Epimenio Foot.   MRI of the brain and spine completed 10/2021, the spinal cord was normal, the brain shows a small number of T2/FLAIR hyperintense foci.  Most were nonspecific, 1 was periventricular.  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  She has fluctuating painful dysesthesias in her feet.  Gait is sometimes off.   She has had 2 falls the last couple months due to balance.   She uses the bannister on stairs  She uses a cane intermittently.   She notes weakness in legs and numbness in feet with some pain.    She takes tizanidine up to 4 mg po tid (varies dose) for spasticity.  She is on gabapentin 400 mg po tid to qid prn      She has some urinary incontinence and uses pads.   Ditropan dried her out so she prefers not to be on one.   Vision is mildly off.  She has fatigue, helped by Adderall.    She sleeps well at night.   She does not snore.  She notes cognitive issues with brain fog and poor focus.    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     Nuvigil was less effective.      She is on Adderall XR 30 mg po qd.    She notes some depression and irritability and feels gabapentin may worsen it.   Also worse since the move up here as does not know a lot of people.         History of MS diagnosis: She is a 57 year old woman who was diagnosed with MS in 2005.   She had balance issues and slurred speech.  She had a positive FH of MS (her paternal grandmother).    She  was placed on Avonex but developed antibodies.    She saw Dr. Dr. Araceli Bouche in [place], [place]. MRi was reportedly c/w MS.  She also had a lumbar puncture.   She was in the [facility] trial (Dr. Araceli Bouche) and had treatments in 2009, 2010 and 2018.   She had frequent  MRI as part of the trial and reports she had progression.Marland Kitchen   She was on Gilenya briefly in 2017.   Due to new onset vertigo, she had an MRI and was told she had no new lesions.      Her last brain MRI was performed 01/02/2021 in [place].  She was able to pull up the results on her phone.  We do not have the images.  It was read as showing tiny infarcts in cerebellum and non-specific white matter changes but no acute findings.   It was performed at [facility]  in [place], [place]    (Phone [phone] / Fax [phone]).  We will try to get the images.  She is a retired Film/video editor.     IMAGING: MRI of the brain 10/30/2021 showed several small T2/FLAIR hyperintense foci in the hemispheres.  These were nonspecific but could be consistent with  demyelination or chronic microvascular ischemic change..  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  MRI of the thoracic spine 11/02/2021 showed a normal spinal cord and no significant degenerative changes  REVIEW OF SYSTEMS: Constitutional: No fevers, chills, sweats, or change in appetite.  She has fatigue. Eyes: No visual changes, double vision, eye pain Ear, nose and throat: No hearing loss, ear pain, nasal congestion, sore throat Cardiovascular: No chest pain, palpitations Respiratory:  No shortness of breath at rest or with exertion.   No wheezes GastrointestinaI: No nausea, vomiting, diarrhea, abdominal pain, fecal incontinence Genitourinary:  No dysuria, urinary retention or frequency.  No  nocturia. Musculoskeletal:  No neck pain, back pain Integumentary: No rash, pruritus, skin lesions Neurological: as above Psychiatric: No depression at this time.  No anxiety Endocrine: No palpitations, diaphoresis, change in appetite, change in weigh or increased thirst Hematologic/Lymphatic:  No anemia, purpura, petechiae. Allergic/Immunologic: No itchy/runny eyes, nasal congestion, recent allergic reactions, rashes  ALLERGIES: Allergies  Allergen Reactions   Iodinated Contrast Media Other (See Comments)    HOME MEDICATIONS:  Current Outpatient Medications:    acyclovir (ZOVIRAX) 400 MG tablet, TAKE 1 TABLET BY MOUTH EVERY DAY, Disp: 90 tablet, Rfl: 0   albuterol (VENTOLIN HFA) 108 (90 Base) MCG/ACT inhaler, Inhale 2 puffs into the lungs every 6 (six) hours as needed for wheezing or shortness of breath., Disp: 8 g, Rfl: 0   amphetamine-dextroamphetamine (ADDERALL XR) 30 MG 24 hr capsule, Take 1 capsule (30 mg total) by mouth daily., Disp: 30 capsule, Rfl: 0   Cetirizine HCl (ZYRTEC ALLERGY) 10 MG CAPS, Zyrtec, Disp: , Rfl:    estradiol (VIVELLE-DOT) 0.1 MG/24HR patch, estradiol 0.1 mg/24 hr semiweekly transdermal patch  APPLY 1 PATCHED TOPICALLY TWICE WEEKLY, Disp: 8 patch, Rfl: 2   fluticasone (FLONASE) 50 MCG/ACT nasal spray, fluticasone propionate 50 mcg/actuation nasal spray,suspension  USE 1 SPRAY NASALLY IN EACH NOSTRIL ONCE DAILY, Disp: , Rfl:    gabapentin (NEURONTIN) 800 MG tablet, Take 1 tablet (800 mg total) by mouth 2 (two) times daily., Disp: 180 tablet, Rfl: 3   levothyroxine (SYNTHROID) 150 MCG tablet, TAKE 1 TABLET BY MOUTH EVERY DAY, Disp: 90 tablet, Rfl: 2   ondansetron (ZOFRAN) 4 MG tablet, Take 4 mg by mouth every 8 (eight) hours as needed for nausea or vomiting., Disp: , Rfl:    oxyCODONE-acetaminophen (PERCOCET/ROXICET) 5-325 MG tablet, Take 1 tablet by mouth every 6 (six) hours as needed for severe pain., Disp: , Rfl:    predniSONE (STERAPRED UNI-PAK 21 TAB) 10 MG  (21) TBPK tablet, Take by mouth daily. Take 6 tabs by mouth daily  for 2 days, then 5 tabs for 2 days, then 4 tabs for 2 days, then 3 tabs for 2 days, 2 tabs for 2 days, then 1 tab by mouth daily for 2 days, Disp: 42 tablet, Rfl: 0   sertraline (ZOLOFT) 50 MG tablet, Take 1 tablet (50 mg total) by mouth daily., Disp: 90 tablet, Rfl: 3   tamsulosin (FLOMAX) 0.4 MG CAPS capsule, Take 0.4 mg by mouth., Disp: , Rfl:    tiZANidine (ZANAFLEX) 4 MG tablet, tizanidine 4 mg tablet TAKE 1 TABLET BY MOUTH THREE TIMES A DAY AS NEEDED, Disp: 270 tablet, Rfl: 3  PAST MEDICAL HISTORY: Past Medical History:  Diagnosis Date   ADHD (attention deficit hyperactivity  disorder)    Asthma    History of kidney stones    Hypothyroidism    Multiple sclerosis ([place])    Neuromuscular disorder ([place])     PAST SURGICAL HISTORY: Past Surgical History:  Procedure Laterality Date   ABDOMINAL HYSTERECTOMY     APPENDECTOMY     CESAREAN SECTION     CHOLECYSTECTOMY     EXTRACORPOREAL SHOCK WAVE LITHOTRIPSY Right 01/02/2022   Procedure: EXTRACORPOREAL SHOCK WAVE LITHOTRIPSY (ESWL);  Surgeon: Raynelle Bring, MD;  Location: [facility];  Service: Urology;  Laterality: Right;    FAMILY HISTORY: Family History  Problem Relation Age of Onset   Heart attack Mother    Hypertension Father    Diabetes type II Father    Multiple sclerosis Paternal Grandmother     SOCIAL HISTORY:  Social History   Socioeconomic History   Marital status: Divorced    Spouse name: Not on file   Number of children: Not on file   Years of education: Not on file   Highest education level: Not on file  Occupational History   Not on file  Tobacco Use   Smoking status: Former    Types: Cigarettes    Quit date: 1985    Years since quitting: 39.0   Smokeless tobacco: Never  Substance and Sexual Activity   Alcohol use: Yes    Comment: socaially   Drug use: Never   Sexual activity: Not on file  Other Topics Concern   Not  on file  Social History Narrative   Not on file   Social Determinants of Health   Financial Resource Strain: Not on file  Food Insecurity: Not on file  Transportation Needs: Not on file  Physical Activity: Not on file  Stress: Not on file  Social Connections: Not on file  Intimate Partner Violence: Not on file     PHYSICAL EXAM  There were no vitals filed for this visit.   There is no height or weight on file to calculate BMI.   General: The patient is well-developed and well-nourished and in no acute distress  HEENT:  Head is [place]/AT.  Sclera are anicteric.     Skin: Extremities are without rash or  edema.   Neurologic Exam  Mental status: The patient is alert and oriented x 3 at the time of the examination. The patient has apparent normal recent and remote memory, with an apparently normal attention span and concentration ability.   Speech is normal.  Cranial nerves: Extraocular movements are full. Pupils are equal, round, and reactive to light and accomodation.  Facial strength and sensation was normal..  Trapezius and sternocleidomastoid strength is normal. No dysarthria is noted.   No obvious hearing deficits are noted.  Motor:  Muscle bulk is normal.   Tone is mildly inc in right leg. Strength is  5 / 5 in all 4 extremities except 4+/5 right iliopsoas.   Sensory: Sensory testing is intact to pinprick, soft touch and vibration sensation in amrs.   She reports reduced right leg vibration sensation.    Coordination: Cerebellar testing reveals good finger-nose-finger and heel-to-shin bilaterally.  Gait and station: Station is normal.   The gait has a slight limp.  The tandem gait is wide.   Tandem gait is normal. Romberg is borderline.   Reflexes: Deep tendon reflexes are symmetric and normal in arms and mildly increased right ankle.          ASSESSMENT AND PLAN  No diagnosis found.  She has a low lesion burden and no recent activty (relapse of MRI).  She was on  alemtuzumab and I will keep her off a DMT at this time.   We will wnt to recheck and MRI in late 2024 or early 2025. Continue Adderall.   Add sertralie. Rtc 6 months   sooner I new or worsening neurologic issues     I spent *** minutes of face-to-face and non-face-to-face time with patient.  This included previsit chart review, lab review, study review, order entry, electronic health record documentation, patient education  Ihor Austin, [facility]  [facility] [address] [place], [place] [address]  Phone [phone] Fax [phone] Note: This document was prepared with digital dictation and possible smart phrase technology. Any transcriptional errors that result from this process are unintentional.

## 2022-10-23 ENCOUNTER — Encounter: Payer: Self-pay | Admitting: Adult Health

## 2022-10-23 ENCOUNTER — Ambulatory Visit (INDEPENDENT_AMBULATORY_CARE_PROVIDER_SITE_OTHER): Payer: Medicare Other

## 2022-10-23 ENCOUNTER — Ambulatory Visit (INDEPENDENT_AMBULATORY_CARE_PROVIDER_SITE_OTHER): Payer: Medicare Other | Admitting: Adult Health

## 2022-10-23 VITALS — BP 116/75 | HR 72 | Ht 62.0 in | Wt 218.2 lb

## 2022-10-23 VITALS — Ht 62.0 in | Wt 218.0 lb

## 2022-10-23 DIAGNOSIS — R269 Unspecified abnormalities of gait and mobility: Secondary | ICD-10-CM | POA: Diagnosis not present

## 2022-10-23 DIAGNOSIS — Z0000 Encounter for general adult medical examination without abnormal findings: Secondary | ICD-10-CM

## 2022-10-23 DIAGNOSIS — G35 Multiple sclerosis: Secondary | ICD-10-CM

## 2022-10-23 DIAGNOSIS — Z8619 Personal history of other infectious and parasitic diseases: Secondary | ICD-10-CM

## 2022-10-23 DIAGNOSIS — R3915 Urgency of urination: Secondary | ICD-10-CM

## 2022-10-23 DIAGNOSIS — F988 Other specified behavioral and emotional disorders with onset usually occurring in childhood and adolescence: Secondary | ICD-10-CM | POA: Diagnosis not present

## 2022-10-23 DIAGNOSIS — F32A Depression, unspecified: Secondary | ICD-10-CM

## 2022-10-23 MED ORDER — ACYCLOVIR 400 MG PO TABS: 400.0000 mg | ORAL_TABLET | Freq: Every day | ORAL | 3 refills | Status: DC

## 2022-10-23 MED ORDER — TIZANIDINE HCL 4 MG PO TABS: ORAL_TABLET | ORAL | 3 refills | Status: DC

## 2022-10-23 MED ORDER — GABAPENTIN 800 MG PO TABS: 800.0000 mg | ORAL_TABLET | Freq: Two times a day (BID) | ORAL | 3 refills | Status: DC

## 2022-10-23 MED ORDER — SERTRALINE HCL 100 MG PO TABS: 100.0000 mg | ORAL_TABLET | Freq: Every day | ORAL | 5 refills | Status: DC

## 2022-10-23 NOTE — Progress Notes (Signed)
I connected with Amy Burgess today by telephone and verified that I am speaking with the correct person using two identifiers. Location patient: home Location provider: work Persons participating in the virtual visit: Cash Chaudhary, Glenna Durand LPN.   I discussed the limitations, risks, security and privacy concerns of performing an evaluation and management service by telephone and the availability of in person appointments. I also discussed with the patient that there may be a patient responsible charge related to this service. The patient expressed understanding and verbally consented to this telephonic visit.    Interactive audio and video telecommunications were attempted between this provider and patient, however failed, due to patient having technical difficulties OR patient did not have access to video capability.  We continued and completed visit with audio only.     Vital signs may be patient reported or missing.  Subjective:   Amy Burgess is a 57 y.o. female who presents for an Initial Medicare Annual Wellness Visit.  Review of Systems     Cardiac Risk Factors include: advanced age (>58men, >56 women);obesity (BMI >30kg/m2)     Objective:    Today's Vitals   10/23/22 1441 10/23/22 1442  Weight: 218 lb (98.9 kg)   Height: 5\' 2"  (1.575 m)   PainSc:  3    Body mass index is 39.87 kg/m.     10/23/2022    2:49 PM 01/02/2022    1:54 PM  Advanced Directives  Does Patient Have a Medical Advance Directive? No No  Would patient like information on creating a medical advance directive?  No - Patient declined    Current Medications (verified) Outpatient Encounter Medications as of 10/23/2022  Medication Sig   acyclovir (ZOVIRAX) 400 MG tablet Take 1 tablet (400 mg total) by mouth daily.   albuterol (VENTOLIN HFA) 108 (90 Base) MCG/ACT inhaler Inhale 2 puffs into the lungs every 6 (six) hours as needed for wheezing or shortness of breath.   amphetamine-dextroamphetamine  (ADDERALL XR) 30 MG 24 hr capsule Take 1 capsule (30 mg total) by mouth daily.   Cetirizine HCl (ZYRTEC ALLERGY) 10 MG CAPS Zyrtec   estradiol (VIVELLE-DOT) 0.1 MG/24HR patch estradiol 0.1 mg/24 hr semiweekly transdermal patch  APPLY 1 PATCHED TOPICALLY TWICE WEEKLY   fluticasone (FLONASE) 50 MCG/ACT nasal spray fluticasone propionate 50 mcg/actuation nasal spray,suspension  USE 1 SPRAY NASALLY IN EACH NOSTRIL ONCE DAILY   gabapentin (NEURONTIN) 800 MG tablet Take 1 tablet (800 mg total) by mouth 2 (two) times daily.   levothyroxine (SYNTHROID) 150 MCG tablet TAKE 1 TABLET BY MOUTH EVERY DAY   ondansetron (ZOFRAN) 4 MG tablet Take 4 mg by mouth every 8 (eight) hours as needed for nausea or vomiting.   sertraline (ZOLOFT) 100 MG tablet Take 1 tablet (100 mg total) by mouth daily.   tiZANidine (ZANAFLEX) 4 MG tablet tizanidine 4 mg tablet TAKE 1 TABLET BY MOUTH THREE TIMES A DAY AS NEEDED   No facility-administered encounter medications on file as of 10/23/2022.    Allergies (verified) Iodinated contrast media   History: Past Medical History:  Diagnosis Date   ADHD (attention deficit hyperactivity disorder)    Asthma    History of kidney stones    Hypothyroidism    Multiple sclerosis ([place])    Neuromuscular disorder ([place])    Past Surgical History:  Procedure Laterality Date   ABDOMINAL HYSTERECTOMY     APPENDECTOMY     CESAREAN SECTION     CHOLECYSTECTOMY     EXTRACORPOREAL SHOCK  WAVE LITHOTRIPSY Right 01/02/2022   Procedure: EXTRACORPOREAL SHOCK WAVE LITHOTRIPSY (ESWL);  Surgeon: Raynelle Bring, MD;  [facility];  Service: Urology;  Laterality: Right;   Family History  Problem Relation Age of Onset   Heart attack Mother    Hypertension Father    Diabetes type II Father    Multiple sclerosis Paternal Grandmother    Social History   Socioeconomic History   Marital status: Divorced    Spouse name: Not on file   Number of children: Not on file   Years  of education: Not on file   Highest education level: Not on file  Occupational History   Not on file  Tobacco Use   Smoking status: Former    Types: Cigarettes    Quit date: 31    Years since quitting: 39.0   Smokeless tobacco: Never  Vaping Use   Vaping Use: Never used  Substance and Sexual Activity   Alcohol use: Not Currently    Comment: socaially   Drug use: Never   Sexual activity: Not on file  Other Topics Concern   Not on file  Social History Narrative   Not on file   Social Determinants of Health   Financial Resource Strain: Low Risk  (10/23/2022)   Overall Financial Resource Strain (CARDIA)    Difficulty of Paying Living Expenses: Not hard at all  Food Insecurity: No Food Insecurity (10/23/2022)   Hunger Vital Sign    Worried About Running Out of Food in the Last Year: Never true    [place] in the Last Year: Never true  Transportation Needs: No Transportation Needs (10/23/2022)   PRAPARE - Hydrologist (Medical): No    Lack of Transportation (Non-Medical): No  Physical Activity: Inactive (10/23/2022)   Exercise Vital Sign    Days of Exercise per Week: 0 days    Minutes of Exercise per Session: 0 min  Stress: No Stress Concern Present (10/23/2022)   [place]    Feeling of Stress : Not at all  Social Connections: Not on file    Tobacco Counseling Counseling given: Not Answered   Clinical Intake:  Pre-visit preparation completed: Yes  Pain : 0-10 Pain Score: 3  Pain Type: Chronic pain Pain Location: Hip Pain Orientation: Left, Right Pain Descriptors / Indicators: Aching Pain Onset: More than a month ago Pain Frequency: Constant     Nutritional Status: BMI > 30  Obese Nutritional Risks: None Diabetes: No  How often do you need to have someone help you when you read instructions, pamphlets, or other written materials from your doctor or  pharmacy?: 1 - Never  Diabetic? no  Interpreter Needed?: No  Information entered by :: NAllen LPN   Activities of Daily Living    10/23/2022    2:51 PM 10/20/2022    3:31 PM  In your present state of health, do you have any difficulty performing the following activities:  Hearing? 0 0  Vision? 0 0  Difficulty concentrating or making decisions? 1 1  Walking or climbing stairs? 1 1  Dressing or bathing? 0 1  Doing errands, shopping? 0 1  Preparing Food and eating ? N Y  Using the Toilet? N N  In the past six months, have you accidently leaked urine? Y Y  Do you have problems with loss of bowel control? Y Y  Managing your Medications? N N  Managing  your Finances? N N  Housekeeping or managing your Housekeeping? [place]    Patient Care Team: Georganna Skeans, MD as PCP - General (Family Medicine)  Indicate any recent Medical Services you may have received from other than [facility] providers in the past year (date may be approximate).     Assessment:   This is a routine wellness examination for Amy Burgess.  Hearing/Vision screen Vision Screening - Comments:: Regular eye exams,   Dietary issues and exercise activities discussed: Current Exercise Habits: The patient does not participate in regular exercise at present   Goals Addressed             This Visit's Progress    Patient Stated       10/23/2022, wants to increase stamina       Depression Screen    10/23/2022    2:50 PM 05/20/2022    8:33 AM 12/09/2021    8:17 AM 09/09/2021    8:23 AM  PHQ 2/9 Scores  PHQ - 2 Score 0 2 2 2   PHQ- 9 Score  8 8 7     Fall Risk    10/23/2022    2:50 PM 10/20/2022    3:31 PM  Fall Risk   Falls in the past year? 1 1  Number falls in past yr: 1 1  Injury with Fall? 0 0  Risk for fall due to : History of fall(s);Impaired balance/gait;Impaired mobility;Medication side effect   Follow up Falls evaluation completed;Education provided;Falls prevention discussed     FALL RISK PREVENTION  PERTAINING TO THE HOME:  Any stairs in or around the home? Yes  If so, are there any without handrails? No  Home free of loose throw rugs in walkways, pet beds, electrical cords, etc? Yes  Adequate lighting in your home to reduce risk of falls? Yes   ASSISTIVE DEVICES UTILIZED TO PREVENT FALLS:  Life alert? No  Use of a cane, walker or w/c? Yes  Grab bars in the bathroom? No  Shower chair or bench in shower? Yes  Elevated toilet seat or a handicapped toilet? Yes   TIMED UP AND GO:  Was the test performed? No .      Cognitive Function:        10/23/2022    2:52 PM  6CIT Screen  What Year? 0 points  What month? 0 points  What time? 0 points  Count back from 20 0 points  Months in reverse 0 points  Repeat phrase 0 points  Total Score 0 points    Immunizations Immunization History  Administered Date(s) Administered   Influenza,inj,Quad PF,6+ Mos 07/25/2021, 07/25/2022   Tetanus 12/07/2016   Unspecified SARS-COV-2 Vaccination 07/25/2022    TDAP status: Up to date  Flu Vaccine status: Up to date  Pneumococcal vaccine status: Up to date  Covid-19 vaccine status: Completed vaccines  Qualifies for Shingles Vaccine? Yes   Zostavax completed No   Shingrix Completed?: No.    Education has been provided regarding the importance of this vaccine. Patient has been advised to call insurance company to determine out of pocket expense if they have not yet received this vaccine. Advised may also receive vaccine at local pharmacy or Health Dept. Verbalized acceptance and understanding.  Screening Tests Health Maintenance  Topic Date Due   Medicare Annual Wellness (AWV)  Never done   HIV Screening  Never done   Hepatitis C Screening  Never done   PAP SMEAR-Modifier  Never done   Zoster Vaccines- Shingrix (1  of 2) Never done   DTaP/Tdap/Td (1 - Tdap) 12/08/2016   COVID-19 Vaccine (2 - 2023-24 season) 09/19/2022   COLONOSCOPY (Pts 45-54yrs Insurance coverage will need to be  confirmed)  10/12/2024 (Originally 11/06/2009)   MAMMOGRAM  11/06/2023   INFLUENZA VACCINE  Completed   HPV VACCINES  Aged Out    Health Maintenance  Health Maintenance Due  Topic Date Due   Medicare Annual Wellness (AWV)  Never done   HIV Screening  Never done   Hepatitis C Screening  Never done   PAP SMEAR-Modifier  Never done   Zoster Vaccines- Shingrix (1 of 2) Never done   DTaP/Tdap/Td (1 - Tdap) 12/08/2016   COVID-19 Vaccine (2 - 2023-24 season) 09/19/2022    Colorectal cancer screening: states had at age 50  Mammogram status: scheduled for 11/06/2022  Bone Density status: n/a  Lung Cancer Screening: (Low Dose CT Chest recommended if Age 51-80 years, 30 pack-year currently smoking OR have quit w/in 15years.) does not qualify.   Lung Cancer Screening Referral: no  Additional Screening:  Hepatitis C Screening: does qualify;  Vision Screening: Recommended annual ophthalmology exams for early detection of glaucoma and other disorders of the eye. Is the patient up to date with their annual eye exam?  Yes  Who is the provider or what is the name of the office in which the patient attends annual eye exams?  If pt is not established with a provider, would they like to be referred to a provider to establish care? No .   Dental Screening: Recommended annual dental exams for proper oral hygiene  Community Resource Referral / Chronic Care Management: CRR required this visit?  No   CCM required this visit?  No      Plan:     I have personally reviewed and noted the following in the patient's chart:   Medical and social history Use of alcohol, tobacco or illicit drugs  Current medications and supplements including opioid prescriptions. Patient is not currently taking opioid prescriptions. Functional ability and status Nutritional status Physical activity Advanced directives List of other physicians Hospitalizations, surgeries, and ER visits in previous 12  months Vitals Screenings to include cognitive, depression, and falls Referrals and appointments  In addition, I have reviewed and discussed with patient certain preventive protocols, quality metrics, and best practice recommendations. A written personalized care plan for preventive services as well as general preventive health recommendations were provided to patient.     Barb Merino, LPN   5/73/2202   Nurse Notes: none  Due to this being a virtual visit, the after visit summary with patients personalized plan was offered to patient via mail or my-chart. Patient would like to access on my-chart

## 2022-10-23 NOTE — Patient Instructions (Signed)
Amy Burgess , Thank you for taking time to come for your Medicare Wellness Visit. I appreciate your ongoing commitment to your health goals. Please review the following plan we discussed and let me know if I can assist you in the future.   These are the goals we discussed:  Goals      Patient Stated     10/23/2022, wants to increase stamina        This is a list of the screening recommended for you and due dates:  Health Maintenance  Topic Date Due   HIV Screening  Never done   Hepatitis C Screening: USPSTF Recommendation to screen - Ages 20-79 yo.  Never done   Pap Smear  Never done   Zoster (Shingles) Vaccine (1 of 2) Never done   DTaP/Tdap/Td vaccine (1 - Tdap) 12/08/2016   COVID-19 Vaccine (2 - 2023-24 season) 09/19/2022   Colon Cancer Screening  10/12/2024*   Medicare Annual Wellness Visit  10/24/2023   Mammogram  11/06/2023   Flu Shot  Completed   HPV Vaccine  Aged Out  *Topic was postponed. The date shown is not the original due date.    Advanced directives: Advance directive discussed with you today.   Conditions/risks identified: none  Next appointment: Follow up in one year for your annual wellness visit.   Preventive Care 40-64 Years, Female Preventive care refers to lifestyle choices and visits with your health care provider that can promote health and wellness. What does preventive care include? A yearly physical exam. This is also called an annual well check. Dental exams once or twice a year. Routine eye exams. Ask your health care provider how often you should have your eyes checked. Personal lifestyle choices, including: Daily care of your teeth and gums. Regular physical activity. Eating a healthy diet. Avoiding tobacco and drug use. Limiting alcohol use. Practicing safe sex. Taking low-dose aspirin daily starting at age 38. Taking vitamin and mineral supplements as recommended by your health care provider. What happens during an annual well check? The  services and screenings done by your health care provider during your annual well check will depend on your age, overall health, lifestyle risk factors, and family history of disease. Counseling  Your health care provider may ask you questions about your: Alcohol use. Tobacco use. Drug use. Emotional well-being. Home and relationship well-being. Sexual activity. Eating habits. Work and work Statistician. Method of birth control. Menstrual cycle. Pregnancy history. Screening  You may have the following tests or measurements: Height, weight, and BMI. Blood pressure. Lipid and cholesterol levels. These may be checked every 5 years, or more frequently if you are over 65 years old. Skin check. Lung cancer screening. You may have this screening every year starting at age 34 if you have a 30-pack-year history of smoking and currently smoke or have quit within the past 15 years. Fecal occult blood test (FOBT) of the stool. You may have this test every year starting at age 44. Flexible sigmoidoscopy or colonoscopy. You may have a sigmoidoscopy every 5 years or a colonoscopy every 10 years starting at age 48. Hepatitis C blood test. Hepatitis B blood test. Sexually transmitted disease (STD) testing. Diabetes screening. This is done by checking your blood sugar (glucose) after you have not eaten for a while (fasting). You may have this done every 1-3 years. Mammogram. This may be done every 1-2 years. Talk to your health care provider about when you should start having regular mammograms. This may depend  on whether you have a family history of breast cancer. BRCA-related cancer screening. This may be done if you have a family history of breast, ovarian, tubal, or peritoneal cancers. Pelvic exam and Pap test. This may be done every 3 years starting at age 69. Starting at age 29, this may be done every 5 years if you have a Pap test in combination with an HPV test. Bone density scan. This is done to  screen for osteoporosis. You may have this scan if you are at high risk for osteoporosis. Discuss your test results, treatment options, and if necessary, the need for more tests with your health care provider. Vaccines  Your health care provider may recommend certain vaccines, such as: Influenza vaccine. This is recommended every year. Tetanus, diphtheria, and acellular pertussis (Tdap, Td) vaccine. You may need a Td booster every 10 years. Zoster vaccine. You may need this after age 71. Pneumococcal 13-valent conjugate (PCV13) vaccine. You may need this if you have certain conditions and were not previously vaccinated. Pneumococcal polysaccharide (PPSV23) vaccine. You may need one or two doses if you smoke cigarettes or if you have certain conditions. Talk to your health care provider about which screenings and vaccines you need and how often you need them. This information is not intended to replace advice given to you by your health care provider. Make sure you discuss any questions you have with your health care provider. Document Released: 10/26/2015 Document Revised: 06/18/2016 Document Reviewed: 07/31/2015 Elsevier Interactive Patient Education  2017 [place] Prevention in the Home Falls can cause injuries. They can happen to people of all ages. There are many things you can do to make your home safe and to help prevent falls. What can I do on the outside of my home? Regularly fix the edges of walkways and driveways and fix any cracks. Remove anything that might make you trip as you walk through a door, such as a raised step or threshold. Trim any bushes or trees on the path to your home. Use bright outdoor lighting. Clear any walking paths of anything that might make someone trip, such as rocks or tools. Regularly check to see if handrails are loose or broken. Make sure that both sides of any steps have handrails. Any raised decks and porches should have guardrails on  the edges. Have any leaves, snow, or ice cleared regularly. Use sand or salt on walking paths during winter. Clean up any spills in your garage right away. This includes oil or grease spills. What can I do in the bathroom? Use night lights. Install grab bars by the toilet and in the tub and shower. Do not use towel bars as grab bars. Use non-skid mats or decals in the tub or shower. If you need to sit down in the shower, use a plastic, non-slip stool. Keep the floor dry. Clean up any water that spills on the floor as soon as it happens. Remove soap buildup in the tub or shower regularly. Attach bath mats securely with double-sided non-slip rug tape. Do not have throw rugs and other things on the floor that can make you trip. What can I do in the bedroom? Use night lights. Make sure that you have a light by your bed that is easy to reach. Do not use any sheets or blankets that are too big for your bed. They should not hang down onto the floor. Have a firm chair that has side arms. You can use  this for support while you get dressed. Do not have throw rugs and other things on the floor that can make you trip. What can I do in the kitchen? Clean up any spills right away. Avoid walking on wet floors. Keep items that you use a lot in easy-to-reach places. If you need to reach something above you, use a strong step stool that has a grab bar. Keep electrical cords out of the way. Do not use floor polish or wax that makes floors slippery. If you must use wax, use non-skid floor wax. Do not have throw rugs and other things on the floor that can make you trip. What can I do with my stairs? Do not leave any items on the stairs. Make sure that there are handrails on both sides of the stairs and use them. Fix handrails that are broken or loose. Make sure that handrails are as long as the stairways. Check any carpeting to make sure that it is firmly attached to the stairs. Fix any carpet that is loose  or worn. Avoid having throw rugs at the top or bottom of the stairs. If you do have throw rugs, attach them to the floor with carpet tape. Make sure that you have a light switch at the top of the stairs and the bottom of the stairs. If you do not have them, ask someone to add them for you. What else can I do to help prevent falls? Wear shoes that: Do not have high heels. Have rubber bottoms. Are comfortable and fit you well. Are closed at the toe. Do not wear sandals. If you use a stepladder: Make sure that it is fully opened. Do not climb a closed stepladder. Make sure that both sides of the stepladder are locked into place. Ask someone to hold it for you, if possible. Clearly mark and make sure that you can see: Any grab bars or handrails. First and last steps. Where the edge of each step is. Use tools that help you move around (mobility aids) if they are needed. These include: Canes. Walkers. Scooters. Crutches. Turn on the lights when you go into a dark area. Replace any light bulbs as soon as they burn out. Set up your furniture so you have a clear path. Avoid moving your furniture around. If any of your floors are uneven, fix them. If there are any pets around you, be aware of where they are. Review your medicines with your doctor. Some medicines can make you feel dizzy. This can increase your chance of falling. Ask your doctor what other things that you can do to help prevent falls. This information is not intended to replace advice given to you by your health care provider. Make sure you discuss any questions you have with your health care provider. Document Released: 07/26/2009 Document Revised: 03/06/2016 Document Reviewed: 11/03/2014 Elsevier Interactive Patient Education  2017 [employer].

## 2022-10-23 NOTE — Patient Instructions (Addendum)
Your Plan:  Continue gabapentin, tizanidine and adderall at current dosages  Continue acyclovir at current dosage   Increase sertraline to 100mg  nightly      Follow-up with Dr. Felecia Shelling in 6 months or call earlier if needed     Thank you for coming to see [place] at [facility]. I hope we have been able to provide you high quality care today.  You may receive a patient satisfaction survey over the next few weeks. We would appreciate your feedback and comments so that we may continue to improve ourselves and the health of our patients.

## 2022-11-03 ENCOUNTER — Other Ambulatory Visit: Payer: Self-pay | Admitting: Family Medicine

## 2022-11-03 ENCOUNTER — Encounter: Payer: Self-pay | Admitting: Family Medicine

## 2022-11-03 MED ORDER — OSELTAMIVIR PHOSPHATE 75 MG PO CAPS: 75.0000 mg | ORAL_CAPSULE | Freq: Two times a day (BID) | ORAL | 0 refills | Status: DC

## 2022-11-03 NOTE — Telephone Encounter (Unsigned)
Copied from [place] [id number]. Topic: General - Inquiry >> Nov 03, 2022  1:49 PM Penni Bombard wrote: reason for CRM: Pharmacy called asking how many days for the Tamiflu.  [phone]

## 2022-11-06 DIAGNOSIS — Z1231 Encounter for screening mammogram for malignant neoplasm of breast: Secondary | ICD-10-CM

## 2022-11-13 ENCOUNTER — Other Ambulatory Visit: Payer: Self-pay | Admitting: Neurology

## 2022-11-13 DIAGNOSIS — G35 Multiple sclerosis: Secondary | ICD-10-CM

## 2022-11-13 NOTE — Telephone Encounter (Signed)
Last visit at 04/22/22 Scheduled on 04/23/23 with Dr.Sater

## 2022-11-17 ENCOUNTER — Other Ambulatory Visit: Payer: Self-pay | Admitting: Neurology

## 2022-11-17 DIAGNOSIS — F988 Other specified behavioral and emotional disorders with onset usually occurring in childhood and adolescence: Secondary | ICD-10-CM

## 2022-11-17 MED ORDER — AMPHETAMINE-DEXTROAMPHET ER 30 MG PO CP24: 30.0000 mg | ORAL_CAPSULE | Freq: Every day | ORAL | 0 refills | Status: DC

## 2022-12-17 ENCOUNTER — Other Ambulatory Visit: Payer: Self-pay | Admitting: Neurology

## 2022-12-17 DIAGNOSIS — F988 Other specified behavioral and emotional disorders with onset usually occurring in childhood and adolescence: Secondary | ICD-10-CM

## 2022-12-18 MED ORDER — AMPHETAMINE-DEXTROAMPHET ER 30 MG PO CP24: 30.0000 mg | ORAL_CAPSULE | Freq: Every day | ORAL | 0 refills | Status: DC

## 2023-01-16 ENCOUNTER — Other Ambulatory Visit: Payer: Self-pay | Admitting: Family Medicine

## 2023-01-27 ENCOUNTER — Other Ambulatory Visit: Payer: Self-pay | Admitting: Neurology

## 2023-01-27 DIAGNOSIS — F988 Other specified behavioral and emotional disorders with onset usually occurring in childhood and adolescence: Secondary | ICD-10-CM

## 2023-01-27 MED ORDER — AMPHETAMINE-DEXTROAMPHET ER 30 MG PO CP24: 30.0000 mg | ORAL_CAPSULE | Freq: Every day | ORAL | 0 refills | Status: DC

## 2023-01-27 NOTE — Telephone Encounter (Signed)
Pt last seen on 04/22/22 Follow up scheduled on 04/14/23 Last filled on 12/17/21 # 30 tablets  Rx pending to be signed

## 2023-02-24 ENCOUNTER — Other Ambulatory Visit: Payer: Self-pay | Admitting: Neurology

## 2023-02-24 DIAGNOSIS — F988 Other specified behavioral and emotional disorders with onset usually occurring in childhood and adolescence: Secondary | ICD-10-CM

## 2023-02-25 MED ORDER — AMPHETAMINE-DEXTROAMPHET ER 30 MG PO CP24: 30.0000 mg | ORAL_CAPSULE | Freq: Every day | ORAL | 0 refills | Status: DC

## 2023-02-25 NOTE — Telephone Encounter (Signed)
Last seen on 10/23/22  Follow up scheduled on 04/19/23  Last filled on 01/27/23 #30 tablets (30 day supply)  Rx pending to be signed

## 2023-03-27 ENCOUNTER — Other Ambulatory Visit: Payer: Self-pay | Admitting: Neurology

## 2023-03-27 DIAGNOSIS — F988 Other specified behavioral and emotional disorders with onset usually occurring in childhood and adolescence: Secondary | ICD-10-CM

## 2023-03-30 MED ORDER — AMPHETAMINE-DEXTROAMPHET ER 30 MG PO CP24: 30.0000 mg | ORAL_CAPSULE | Freq: Every day | ORAL | 0 refills | Status: DC

## 2023-03-30 NOTE — Telephone Encounter (Signed)
Last seen on 10/23/22 Follow up scheduled on 04/14/23 Last filled on 02/25/23 #30 tablets (30 day supply) Rx pending to be signed

## 2023-04-12 ENCOUNTER — Other Ambulatory Visit: Payer: Self-pay | Admitting: Adult Health

## 2023-04-12 DIAGNOSIS — F32A Depression, unspecified: Secondary | ICD-10-CM

## 2023-04-12 DIAGNOSIS — G35 Multiple sclerosis: Secondary | ICD-10-CM

## 2023-04-14 ENCOUNTER — Encounter: Payer: Self-pay | Admitting: Neurology

## 2023-04-14 ENCOUNTER — Ambulatory Visit (INDEPENDENT_AMBULATORY_CARE_PROVIDER_SITE_OTHER): Payer: Medicare Other | Admitting: Neurology

## 2023-04-14 ENCOUNTER — Other Ambulatory Visit: Payer: Self-pay | Admitting: Family Medicine

## 2023-04-14 ENCOUNTER — Telehealth: Payer: Self-pay | Admitting: *Deleted

## 2023-04-14 VITALS — BP 128/75 | HR 81 | Ht 62.0 in | Wt 217.0 lb

## 2023-04-14 DIAGNOSIS — F32A Depression, unspecified: Secondary | ICD-10-CM

## 2023-04-14 DIAGNOSIS — G35 Multiple sclerosis: Secondary | ICD-10-CM

## 2023-04-14 DIAGNOSIS — R269 Unspecified abnormalities of gait and mobility: Secondary | ICD-10-CM

## 2023-04-14 DIAGNOSIS — F988 Other specified behavioral and emotional disorders with onset usually occurring in childhood and adolescence: Secondary | ICD-10-CM | POA: Diagnosis not present

## 2023-04-14 MED ORDER — SERTRALINE HCL 100 MG PO TABS: 100.0000 mg | ORAL_TABLET | Freq: Every day | ORAL | 3 refills | Status: DC

## 2023-04-14 NOTE — Telephone Encounter (Signed)
Received form from [employer], attending physician statement/ physical capabiities.  Form completed by Dr. Epimenio Foot at visit.  Copy made and Original given to Hoy Morn in medical records to process.

## 2023-04-14 NOTE — Progress Notes (Signed)
[facility]  PATIENT: Amy Burgess DOB: February 01, 1966  REFERRING DOCTOR OR PCP: Georganna Skeans, MD SOURCE: Patient, notes from primary care,  _________________________________   HISTORICAL  CHIEF COMPLAINT:  Chief Complaint  Patient presents with   Room 10    Pt is here Alone. Pt states that her balance has gotten worse. Pt states that her mood has gotten better with her Zoloft. Pt states that she has fallen more lately and her recent one was Last Thursday. Pt states Pt states that her gait is off. Pt states that she is having more Fatigue throughout the day. Pt states that she is having numbness in the middle of her forehead lately.     HISTORY OF PRESENT ILLNESS:  Amy Burgess is a 57 y.o. woman who was diagnosed with multiple sclerosis in 2015.     Update 04/14/2023: She denies any new neurologic symptoms though she reports progressive issues with her right leg.    Last MRis from 2023 show:   The spinal cord was normal.  The brain shows a small number of T2/FLAIR hyperintense foci.  Most were nonspecific, 1 was periventricular.  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  She has fluctuating painful dysesthesias in her feet.  Gait is sometimes off.   Without her cane, she walks around he house but feels off balanced and had a recent fall in an open area of the house.     She uses the bannister on stairs  She uses a cane intermittently.   She felt she did well April 2024 and was able to go 1/4 mile without the cane.   She notes weakness in legs and numbness in feet with some pain.    She takes tizanidine up to 4 mg po tid (varies dose) for spasticity.  She is on gabapentin 400 mg po tid to qid prn  or similar (often 400-400-800)    She has some urinary incontinence and uses pads.   Ditropan dried her out so she prefers not to be on one.   Vision is mildly off.  She has fatigue, helped by Adderall.    She sleeps well at night.    She does not snore.  She notes cognitive issues with brain fog and poor focus.    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         She is on Adderall XR 30 mg po every day and it also helps her cognitive issues.     Mood is better on sertraline.      History of MS diagnosis: She was diagnosed with MS in 2005.   She had balance issues and slurred speech.  She had a positive FH of MS (her paternal grandmother).    She was placed on Avonex but developed antibodies.    She saw Dr. Dr. Araceli Bouche in [place], [place]. MRi was reportedly c/w MS.  She also had a lumbar puncture.   She was in the [facility] trial (Dr. Araceli Bouche) and had treatments in 2009, 2010 and 2018.   She had frequent  MRI as part of the trial and reports she had progression.Marland Kitchen   She was on Gilenya briefly in 2017.   Due to new onset vertigo, she had an MRI and was told she had no new lesions at that time.   MRI in 2023 showed low plaque burden in brain and no spinal cord lesions.  Marland Kitchen  Her last brain MRI was performed 01/02/2021 in [place].  She was able to pull up the results on her phone.  We do not have the images.  It was read as showing tiny infarcts in cerebellum and non-specific white matter changes but no acute findings.   It was performed at [facility]  in [place], [place]    (Phone [phone] / Fax [phone]).  We will try to get the images.  She is a retired Film/video editor.     IMAGING: MRI of the brain 10/30/2021 showed several small T2/FLAIR hyperintense foci in the hemispheres.  These were nonspecific but could be consistent with demyelination or chronic microvascular ischemic change..  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  MRI of the thoracic spine 11/02/2021 showed a normal spinal  cord and no significant degenerative changes  REVIEW OF SYSTEMS: Constitutional: No fevers, chills, sweats, or change in appetite.  She has fatigue. Eyes: No visual changes, double vision, eye pain Ear, nose and throat: No hearing loss, ear pain, nasal congestion, sore throat Cardiovascular: No chest pain, palpitations Respiratory:  No shortness of breath at rest or with exertion.   No wheezes GastrointestinaI: No nausea, vomiting, diarrhea, abdominal pain, fecal incontinence Genitourinary:  No dysuria, urinary retention or frequency.  No nocturia. Musculoskeletal:  No neck pain, back pain Integumentary: No rash, pruritus, skin lesions Neurological: as above Psychiatric: No depression at this time.  No anxiety Endocrine: No palpitations, diaphoresis, change in appetite, change in weigh or increased thirst Hematologic/Lymphatic:  No anemia, purpura, petechiae. Allergic/Immunologic: No itchy/runny eyes, nasal congestion, recent allergic reactions, rashes  ALLERGIES: Allergies  Allergen Reactions   Iodinated Contrast Media Other (See Comments)    HOME MEDICATIONS:  Current Outpatient Medications:    acyclovir (ZOVIRAX) 400 MG tablet, Take 1 tablet (400 mg total) by mouth daily., Disp: 90 tablet, Rfl: 3   albuterol (VENTOLIN HFA) 108 (90 Base) MCG/ACT inhaler, Inhale 2 puffs into the lungs every 6 (six) hours as needed for wheezing or shortness of breath., Disp: 8 g, Rfl: 0   amphetamine-dextroamphetamine (ADDERALL XR) 30 MG 24 hr capsule, Take 1 capsule (30 mg total) by mouth daily., Disp: 30 capsule, Rfl: 0   Cetirizine HCl (ZYRTEC ALLERGY) 10 MG CAPS, Zyrtec, Disp: , Rfl:    fluticasone (FLONASE) 50 MCG/ACT nasal spray, fluticasone propionate 50 mcg/actuation nasal spray,suspension  USE 1 SPRAY NASALLY IN EACH NOSTRIL ONCE DAILY, Disp: , Rfl:    gabapentin (NEURONTIN) 800 MG tablet, Take 1 tablet (800 mg total) by mouth 2 (two) times daily., Disp: 180 tablet, Rfl: 3   levothyroxine  (SYNTHROID) 150 MCG tablet, TAKE 1 TABLET BY MOUTH EVERY DAY, Disp: 90 tablet, Rfl: 0   ondansetron (ZOFRAN) 4 MG tablet, Take 4 mg by mouth every 8 (eight) hours as needed for nausea or vomiting., Disp: , Rfl:    tiZANidine (ZANAFLEX) 4 MG tablet, tizanidine 4 mg tablet TAKE 1 TABLET BY MOUTH THREE TIMES A DAY AS NEEDED, Disp: 270 tablet, Rfl: 3   estradiol (VIVELLE-DOT) 0.1 MG/24HR patch, estradiol 0.1 mg/24 hr semiweekly transdermal patch  APPLY 1 PATCHED TOPICALLY TWICE WEEKLY, Disp: 8 patch, Rfl: 2   oseltamivir (TAMIFLU) 75 MG capsule, Take 1 capsule (75 mg total) by mouth 2 (two) times daily., Disp: 14 capsule, Rfl: 0   sertraline (ZOLOFT) 100  MG tablet, Take 1 tablet (100 mg total) by mouth daily., Disp: 90 tablet, Rfl: 3  PAST MEDICAL HISTORY: Past Medical History:  Diagnosis Date   ADHD (attention deficit hyperactivity disorder)    Asthma    History of kidney stones    Hypothyroidism    Multiple sclerosis (HCC)    Neuromuscular disorder (HCC)     PAST SURGICAL HISTORY: Past Surgical History:  Procedure Laterality Date   ABDOMINAL HYSTERECTOMY     APPENDECTOMY     CESAREAN SECTION     CHOLECYSTECTOMY     EXTRACORPOREAL SHOCK WAVE LITHOTRIPSY Right 01/02/2022   Procedure: EXTRACORPOREAL SHOCK WAVE LITHOTRIPSY (ESWL);  Surgeon: Heloise Purpura, MD;  Location: [facility];  Service: Urology;  Laterality: Right;    FAMILY HISTORY: Family History  Problem Relation Age of Onset   Heart attack Mother    Hypertension Father    Diabetes type II Father    Multiple sclerosis Paternal Grandmother     SOCIAL HISTORY:  Social History   Socioeconomic History   Marital status: Divorced    Spouse name: Not on file   Number of children: Not on file   Years of education: Not on file   Highest education level: Not on file  Occupational History   Not on file  Tobacco Use   Smoking status: Former    Types: Cigarettes    Quit date: 1985    Years since quitting:  39.5   Smokeless tobacco: Never  Vaping Use   Vaping Use: Never used  Substance and Sexual Activity   Alcohol use: Not Currently    Comment: socaially   Drug use: Never   Sexual activity: Not on file  Other Topics Concern   Not on file  Social History Narrative   Right Handed   Social Determinants of Health   Financial Resource Strain: Low Risk  (10/23/2022)   Overall Financial Resource Strain (CARDIA)    Difficulty of Paying Living Expenses: Not hard at all  Food Insecurity: No Food Insecurity (10/23/2022)   Hunger Vital Sign    Worried About Running Out of Food in the Last Year: Never true    Ran Out of Food in the Last Year: Never true  Transportation Needs: No Transportation Needs (10/23/2022)   PRAPARE - Administrator, Civil Service (Medical): No    Lack of Transportation (Non-Medical): No  Physical Activity: Inactive (10/23/2022)   Exercise Vital Sign    Days of Exercise per Week: 0 days    Minutes of Exercise per Session: 0 min  Stress: No Stress Concern Present (10/23/2022)   [employer] of Occupational Health - Occupational Stress Questionnaire    Feeling of Stress : Not at all  Social Connections: Not on file  Intimate Partner Violence: Not on file     PHYSICAL EXAM  Vitals:   04/14/23 0821  BP: 128/75  Pulse: 81  Weight: 217 lb (98.4 kg)  Height: 5\' 2"  (1.575 m)    Body mass index is 39.69 kg/m.   General: The patient is well-developed and well-nourished and in no acute distress  HEENT:  Head is [place]/AT.  Sclera are anicteric.     Skin: Extremities are without rash or  edema.   Neurologic Exam  Mental status: The patient is alert and oriented x 3 at the time of the examination. The patient has apparent normal recent and remote memory, with an apparently normal attention span and concentration ability.   Speech  is normal.  Cranial nerves: Extraocular movements are full.  Facial strength and sensation was normal..  Trapezius and  sternocleidomastoid strength is normal. No dysarthria is noted.   No obvious hearing deficits are noted.  Motor:  Muscle bulk is normal.  Tone difficult to test in right leg ad she pulls back as leg moved.. Strength is  5 / 5 in all 4 extremities except 4+/5 right iliopsoas.   Sensory: Sensory testing is intact to pinprick, soft touch and vibration sensation in amrs.   She reports reduced right leg vibration sensation.    Coordination: Cerebellar testing reveals good finger-nose-finger.    Gait and station: Station is normal.   The gait has a slight limp but no definite foot drop  The tandem gait is wide.   Tandem gait is normal. Romberg is borderline.   Reflexes: Deep tendon reflexes are symmetric and normal in arms and increased right ankle.          ASSESSMENT AND PLAN  Multiple sclerosis (HCC)  MS (multiple sclerosis) (HCC) - Plan: sertraline (ZOLOFT) 100 MG tablet  Depression, unspecified depression type - Plan: sertraline (ZOLOFT) 100 MG tablet  Gait disturbance  Attention deficit disorder (ADD) without hyperactivity   She has been diagnosed with MS and was on alemtuzumab.  MRI has shown no spinal plaques and a fairly low number of brain lesions.  Therefore, she will continue off of a DMT.  Out on Tuesday Mab can have benefit for many years.  Given her age, it is unlikely that she will need to go back on a disease modifying therapy.  I would like to check MRI again every 12 to 18 months to determine if there is any subclinical activity.   Continue Adderall.    Continue sertraline. Disability papers filled out.  Impairments include right leg spasticity, reduced gait and fatigue Rtc 6 months   sooner I new or worsening neurologic issues   Jeannetta Cerutti A. Epimenio Foot, MD, [facility] 04/14/2023, 9:12 AM Certified in Neurology, Clinical Neurophysiology, Sleep Medicine and Neuroimaging  [facility] [address] [place], [place] [address] [phone]

## 2023-04-23 ENCOUNTER — Ambulatory Visit: Payer: Medicare Other | Admitting: Neurology

## 2023-04-30 ENCOUNTER — Other Ambulatory Visit: Payer: Self-pay | Admitting: Neurology

## 2023-04-30 DIAGNOSIS — F988 Other specified behavioral and emotional disorders with onset usually occurring in childhood and adolescence: Secondary | ICD-10-CM

## 2023-04-30 MED ORDER — AMPHETAMINE-DEXTROAMPHET ER 30 MG PO CP24: 30.0000 mg | ORAL_CAPSULE | Freq: Every day | ORAL | 0 refills | Status: DC

## 2023-04-30 NOTE — Telephone Encounter (Signed)
Last seen 04-14-2023.

## 2023-06-02 ENCOUNTER — Other Ambulatory Visit: Payer: Self-pay | Admitting: Neurology

## 2023-06-02 DIAGNOSIS — F988 Other specified behavioral and emotional disorders with onset usually occurring in childhood and adolescence: Secondary | ICD-10-CM

## 2023-06-03 MED ORDER — AMPHETAMINE-DEXTROAMPHET ER 30 MG PO CP24: 30.0000 mg | ORAL_CAPSULE | Freq: Every day | ORAL | 0 refills | Status: DC

## 2023-06-03 NOTE — Telephone Encounter (Signed)
Last seen on 04/14/23 Follow up scheduled on 10/19/23 Last filled on 04/30/23 #30 tablets (30 day supply) Rx pending to be signed

## 2023-06-04 ENCOUNTER — Ambulatory Visit: Payer: Self-pay

## 2023-06-04 ENCOUNTER — Telehealth: Payer: Medicare Other | Admitting: Nurse Practitioner

## 2023-06-04 DIAGNOSIS — J0140 Acute pansinusitis, unspecified: Secondary | ICD-10-CM

## 2023-06-04 MED ORDER — FLUTICASONE PROPIONATE 50 MCG/ACT NA SUSP: 2.0000 | Freq: Every day | NASAL | 6 refills | Status: DC

## 2023-06-04 MED ORDER — AMOXICILLIN-POT CLAVULANATE 875-125 MG PO TABS
1.0000 | ORAL_TABLET | Freq: Two times a day (BID) | ORAL | 0 refills | Status: AC
Start: 2023-06-04 — End: 2023-06-11

## 2023-06-04 NOTE — Telephone Encounter (Signed)
Summary: Possible sinus infection   Pt believes she has a sinus infection, seeking abx. No appt soon enough         Chief Complaint: Sinus pain, pressure. Headache, teeth hurt. Low grade fever, right ear pressure. Yellow mucus. Symptoms: Above Frequency: 2 days ago Pertinent Negatives: Patient denies SOB Disposition: [] ED /[] Urgent Care (no appt availability in office) / [] Appointment(In office/virtual)/ [x]  [facility]/ [] Home Care/ [] Refused Recommended Disposition /[] [facility]/ []  Follow-up with PCP Additional Notes: Pt. Agrees [facility], no availability in practice.  Reason for Disposition  [1] SEVERE pain AND [2] not improved 2 hours after pain medicine  Answer Assessment - Initial Assessment Questions 1. LOCATION: "Where does it hurt?"      Throbbing headache, teeth hurt 2. ONSET: "When did the sinus pain start?"  (e.g., hours, days)      2 days ago 3. SEVERITY: "How bad is the pain?"   (Scale 1-10; mild, moderate or severe)   - MILD (1-3): doesn't interfere with normal activities    - MODERATE (4-7): interferes with normal activities (e.g., work or school) or awakens from sleep   - SEVERE (8-10): excruciating pain and patient unable to do any normal activities        8 4. RECURRENT SYMPTOM: "Have you ever had sinus problems before?" If Yes, ask: "When was the last time?" and "What happened that time?"      Yes 5. NASAL CONGESTION: "Is the nose blocked?" If Yes, ask: "Can you open it or must you breathe through your mouth?"     No 6. NASAL DISCHARGE: "Do you have discharge from your nose?" If so ask, "What color?"     Thick yellow 7. FEVER: "Do you have a fever?" If Yes, ask: "What is it, how was it measured, and when did it start?"      Low grade 8. OTHER SYMPTOMS: "Do you have any other symptoms?" (e.g., sore throat, cough, earache, difficulty breathing)     Right ear pressure 9. PREGNANCY: "Is there any chance you are pregnant?" "When was your last  menstrual period?"     No  Protocols used: Sinus Pain or Congestion-A-AH

## 2023-06-04 NOTE — Progress Notes (Signed)
Virtual Visit Consent   Amy Burgess, you are scheduled for a virtual visit with a [facility] provider today. Just as with appointments in the office, your consent must be obtained to participate. Your consent will be active for this visit and any virtual visit you may have with one of our providers in the next 365 days. If you have a MyChart account, a copy of this consent can be sent to you electronically.  As this is a virtual visit, video technology does not allow for your provider to perform a traditional examination. This may limit your provider's ability to fully assess your condition. If your provider identifies any concerns that need to be evaluated in person or the need to arrange testing (such as labs, EKG, etc.), we will make arrangements to do so. Although advances in technology are sophisticated, we cannot ensure that it will always work on either your end or our end. If the connection with a video visit is poor, the visit may have to be switched to a telephone visit. With either a video or telephone visit, we are not always able to ensure that we have a secure connection.  By engaging in this virtual visit, you consent to the provision of healthcare and authorize for your insurance to be billed (if applicable) for the services provided during this visit. Depending on your insurance coverage, you may receive a charge related to this service.  I need to obtain your verbal consent now. Are you willing to proceed with your visit today? Amy Burgess has provided verbal consent on 06/04/2023 for a virtual visit (video or telephone). Viviano Simas, FNP  Date: 06/04/2023 8:36 AM  Virtual Visit via Video Note   I, Viviano Simas, connected with  Amy Burgess  ([id number], [birth year]) on 06/04/23 at  8:45 AM EDT by a video-enabled telemedicine application and verified that I am speaking with the correct person using two identifiers.  Location: Patient: Virtual Visit Location  Patient: Home Provider: Virtual Visit Location Provider: Home Office   I discussed the limitations of evaluation and management by telemedicine and the availability of in person appointments. The patient expressed understanding and agreed to proceed.    History of Present Illness: Amy Burgess is a 57 y.o. who identifies as a female who was assigned female at birth, and is being seen today for sinus congestion.   Symptoms include: Today she has a throbbing headache with tenderness to her teeth.  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   Urinary urgency 04/22/2022   Depression 04/22/2022    Allergies:  Allergies  Allergen Reactions   Iodinated Contrast Media Other (See Comments)   Medications:  Current Outpatient Medications:    acyclovir (ZOVIRAX) 400 MG tablet, Take 1 tablet (400 mg total) by mouth daily., Disp: 90 tablet, Rfl: 3   albuterol (VENTOLIN HFA) 108 (90 Base) MCG/ACT inhaler, Inhale 2 puffs into the lungs every 6 (six) hours as needed for  wheezing or shortness of breath., Disp: 8 g, Rfl: 0   amphetamine-dextroamphetamine (ADDERALL XR) 30 MG 24 hr capsule, Take 1 capsule (30 mg total) by mouth daily., Disp: 30 capsule, Rfl: 0   Cetirizine HCl (ZYRTEC ALLERGY) 10 MG CAPS, Zyrtec, Disp: , Rfl:    fluticasone (FLONASE) 50 MCG/ACT nasal spray, fluticasone propionate 50 mcg/actuation nasal spray,suspension  USE 1 SPRAY NASALLY IN EACH NOSTRIL ONCE DAILY, Disp: , Rfl:    gabapentin (NEURONTIN)  800 MG tablet, Take 1 tablet (800 mg total) by mouth 2 (two) times daily., Disp: 180 tablet, Rfl: 3   levothyroxine (SYNTHROID) 150 MCG tablet, TAKE 1 TABLET BY MOUTH EVERY DAY, Disp: 90 tablet, Rfl: 0   ondansetron (ZOFRAN) 4 MG tablet, Take 4 mg by mouth every 8 (eight) hours as needed for nausea or vomiting., Disp: , Rfl:    sertraline (ZOLOFT) 100 MG tablet, Take 1 tablet (100 mg total) by mouth daily., Disp: 90 tablet, Rfl: 3   tiZANidine (ZANAFLEX) 4 MG tablet, tizanidine 4 mg tablet TAKE 1 TABLET BY MOUTH THREE TIMES A DAY AS NEEDED, Disp: 270 tablet, Rfl: 3  Observations/Objective: Patient is well-developed, well-nourished in no acute distress.  Resting comfortably  at home.  Head is normocephalic, atraumatic.  No labored breathing.  Speech is clear and coherent with logical content.  Patient is alert and oriented at baseline.    Assessment and Plan:  1. Acute non-recurrent pansinusitis  Meds ordered this encounter  Medications   amoxicillin-clavulanate (AUGMENTIN) 875-125 MG tablet    Sig: Take 1 tablet by mouth 2 (two) times daily for 7 days. Take with food    Dispense:  14 tablet    Refill:  0   fluticasone (FLONASE) 50 MCG/ACT nasal spray    Sig: Place 2 sprays into both nostrils daily.    Dispense:  16 g    Refill:  6        Follow Up Instructions: I discussed the assessment and treatment plan with the patient. The patient was provided an opportunity to ask questions and all were answered. The patient agreed with the plan and demonstrated an understanding of the instructions.  A copy of instructions were sent to the patient via MyChart unless otherwise noted below.    The patient was advised to call back or seek an in-person evaluation if the symptoms worsen or if the condition fails to improve as anticipated.  Time:  I spent 10 minutes with the patient via telehealth technology discussing the above problems/concerns.    Viviano Simas, FNP

## 2023-06-09 ENCOUNTER — Other Ambulatory Visit: Payer: Self-pay | Admitting: Family Medicine

## 2023-06-09 DIAGNOSIS — Z1231 Encounter for screening mammogram for malignant neoplasm of breast: Secondary | ICD-10-CM

## 2023-06-16 ENCOUNTER — Ambulatory Visit
Admission: RE | Admit: 2023-06-16 | Discharge: 2023-06-16 | Disposition: A | Discharge status: Home / Self Care | Payer: Medicare Other | Source: Ambulatory Visit

## 2023-06-16 DIAGNOSIS — Z1231 Encounter for screening mammogram for malignant neoplasm of breast: Secondary | ICD-10-CM

## 2023-06-18 ENCOUNTER — Encounter: Payer: Self-pay | Admitting: Family Medicine

## 2023-06-18 ENCOUNTER — Other Ambulatory Visit: Payer: Self-pay | Admitting: Family Medicine

## 2023-06-18 ENCOUNTER — Ambulatory Visit (INDEPENDENT_AMBULATORY_CARE_PROVIDER_SITE_OTHER): Payer: Medicare Other | Admitting: Family Medicine

## 2023-06-18 VITALS — BP 123/72 | HR 88 | Temp 97.9°F | Resp 16 | Ht 62.5 in | Wt 211.8 lb

## 2023-06-18 DIAGNOSIS — Z0000 Encounter for general adult medical examination without abnormal findings: Secondary | ICD-10-CM

## 2023-06-18 DIAGNOSIS — R7303 Prediabetes: Secondary | ICD-10-CM

## 2023-06-18 DIAGNOSIS — G35 Multiple sclerosis: Secondary | ICD-10-CM

## 2023-06-18 DIAGNOSIS — E559 Vitamin D deficiency, unspecified: Secondary | ICD-10-CM

## 2023-06-18 DIAGNOSIS — Z13220 Encounter for screening for lipoid disorders: Secondary | ICD-10-CM

## 2023-06-18 DIAGNOSIS — Z1159 Encounter for screening for other viral diseases: Secondary | ICD-10-CM

## 2023-06-18 DIAGNOSIS — Z13228 Encounter for screening for other metabolic disorders: Secondary | ICD-10-CM

## 2023-06-18 DIAGNOSIS — N952 Postmenopausal atrophic vaginitis: Secondary | ICD-10-CM

## 2023-06-18 DIAGNOSIS — Z114 Encounter for screening for human immunodeficiency virus [HIV]: Secondary | ICD-10-CM

## 2023-06-18 DIAGNOSIS — Z8639 Personal history of other endocrine, nutritional and metabolic disease: Secondary | ICD-10-CM

## 2023-06-18 DIAGNOSIS — Z130 Encounter for screening for diseases of the blood and blood-forming organs and certain disorders involving the immune mechanism: Secondary | ICD-10-CM | POA: Diagnosis not present

## 2023-06-18 MED ORDER — ESTROGENS CONJUGATED 0.625 MG/GM VA CREA: 1.0000 | TOPICAL_CREAM | Freq: Every day | VAGINAL | 3 refills | Status: DC

## 2023-06-18 NOTE — Progress Notes (Signed)
Annual Exam

## 2023-06-19 ENCOUNTER — Encounter: Payer: Self-pay | Admitting: Family Medicine

## 2023-06-19 NOTE — Progress Notes (Signed)
Established Patient Office Visit  Subjective    Patient ID: Amy Burgess, female    DOB: Jun 14, 1966  Age: 57 y.o. MRN: [id number]  CC:  Chief Complaint  Patient presents with   Annual Exam    HPI Amy Burgess presents for routine annual exam. Patient reports some painful intercourse that is worsening. It began since menopause.   Outpatient Encounter Medications as of 06/18/2023  Medication Sig   acyclovir (ZOVIRAX) 400 MG tablet Take 1 tablet (400 mg total) by mouth daily.   albuterol (VENTOLIN HFA) 108 (90 Base) MCG/ACT inhaler Inhale 2 puffs into the lungs every 6 (six) hours as needed for wheezing or shortness of breath.   amphetamine-dextroamphetamine (ADDERALL XR) 30 MG 24 hr capsule Take 1 capsule (30 mg total) by mouth daily.   Cetirizine HCl (ZYRTEC ALLERGY) 10 MG CAPS Zyrtec   fluticasone (FLONASE) 50 MCG/ACT nasal spray fluticasone propionate 50 mcg/actuation nasal spray,suspension  USE 1 SPRAY NASALLY IN EACH NOSTRIL ONCE DAILY   fluticasone (FLONASE) 50 MCG/ACT nasal spray Place 2 sprays into both nostrils daily.   gabapentin (NEURONTIN) 800 MG tablet Take 1 tablet (800 mg total) by mouth 2 (two) times daily.   levothyroxine (SYNTHROID) 150 MCG tablet TAKE 1 TABLET BY MOUTH EVERY DAY   ondansetron (ZOFRAN) 4 MG tablet Take 4 mg by mouth every 8 (eight) hours as needed for nausea or vomiting.   sertraline (ZOLOFT) 100 MG tablet Take 1 tablet (100 mg total) by mouth daily.   tiZANidine (ZANAFLEX) 4 MG tablet tizanidine 4 mg tablet TAKE 1 TABLET BY MOUTH THREE TIMES A DAY AS NEEDED   [DISCONTINUED] conjugated estrogens (PREMARIN) vaginal cream Place 1 Applicatorful vaginally at bedtime.   No facility-administered encounter medications on file as of 06/18/2023.    Past Medical History:  Diagnosis Date   ADHD (attention deficit hyperactivity disorder)    Asthma    History of kidney stones    Hypothyroidism    Multiple sclerosis (HCC)    Neuromuscular  disorder (HCC)     Past Surgical History:  Procedure Laterality Date   ABDOMINAL HYSTERECTOMY     APPENDECTOMY     CESAREAN SECTION     CHOLECYSTECTOMY     EXTRACORPOREAL SHOCK WAVE LITHOTRIPSY Right 01/02/2022   Procedure: EXTRACORPOREAL SHOCK WAVE LITHOTRIPSY (ESWL);  Surgeon: Heloise Purpura, MD;  Location: [facility];  Service: Urology;  Laterality: Right;    Family History  Problem Relation Age of Onset   Heart attack Mother    Hypertension Father    Diabetes type II Father    Multiple sclerosis Paternal Grandmother     Social History   Socioeconomic History   Marital status: Divorced    Spouse name: Not on file   Number of children: Not on file   Years of education: Not on file   Highest education level: Associate degree: academic program  Occupational History   Not on file  Tobacco Use   Smoking status: Former    Current packs/day: 0.00    Types: Cigarettes    Quit date: 1985    Years since quitting: 39.7   Smokeless tobacco: Never  Vaping Use   Vaping status: Never Used  Substance and Sexual Activity   Alcohol use: Not Currently    Comment: socaially   Drug use: Never   Sexual activity: Not on file  Other Topics Concern   Not on file  Social History Narrative   Right Handed   Social  Determinants of Health   Financial Resource Strain: Medium Risk (06/16/2023)   Overall Financial Resource Strain (CARDIA)    Difficulty of Paying Living Expenses: Somewhat hard  Food Insecurity: No Food Insecurity (06/16/2023)   Hunger Vital Sign    Worried About Running Out of Food in the Last Year: Never true    Ran Out of Food in the Last Year: Never true  Transportation Needs: No Transportation Needs (06/16/2023)   PRAPARE - Administrator, Civil Service (Medical): No    Lack of Transportation (Non-Medical): No  Physical Activity: Unknown (06/16/2023)   Exercise Vital Sign    Days of Exercise per Week: Patient declined    Minutes of Exercise  per Session: 0 min  Stress: No Stress Concern Present (06/16/2023)   [employer] of Occupational Health - Occupational Stress Questionnaire    Feeling of Stress : Only a little  Social Connections: Moderately Isolated (06/16/2023)   Social Connection and Isolation Panel [NHANES]    Frequency of Communication with Friends and Family: Three times a week    Frequency of Social Gatherings with Friends and Family: Patient declined    Attends Religious Services: Never    Database administrator or Organizations: No    Attends Engineer, structural: Not on file    Marital Status: Living with partner  Intimate Partner Violence: Not At Risk (06/18/2023)   Humiliation, Afraid, Rape, and Kick questionnaire    Fear of Current or Ex-Partner: No    Emotionally Abused: No    Physically Abused: No    Sexually Abused: No    Review of Systems  All other systems reviewed and are negative.       Objective    BP 123/72 (BP Location: Right Arm, Patient Position: Sitting, Cuff Size: Large)   Pulse 88   Temp 97.9 F (36.6 C) (Oral)   Resp 16   Ht 5' 2.5" (1.588 m)   Wt 211 lb 12.8 oz (96.1 kg)   SpO2 94%   BMI 38.12 kg/m   Physical Exam Vitals and nursing note reviewed.  Constitutional:      General: She is not in acute distress. HENT:     Head: Normocephalic and atraumatic.     Right Ear: Tympanic membrane, ear canal and external ear normal.     Left Ear: Tympanic membrane, ear canal and external ear normal.     Nose: Nose normal.     Mouth/Throat:     Mouth: Mucous membranes are moist.     Pharynx: Oropharynx is clear.  Eyes:     Conjunctiva/sclera: Conjunctivae normal.     Pupils: Pupils are equal, round, and reactive to light.  Neck:     Thyroid: No thyromegaly.  Cardiovascular:     Rate and Rhythm: Normal rate and regular rhythm.     Heart sounds: Normal heart sounds. No murmur heard. Pulmonary:     Effort: Pulmonary effort is normal. No respiratory distress.      Breath sounds: Normal breath sounds.  Abdominal:     General: There is no distension.     Palpations: Abdomen is soft. There is no mass.     Tenderness: There is no abdominal tenderness.  Musculoskeletal:        General: Normal range of motion.     Cervical back: Normal range of motion and neck supple.  Skin:    General: Skin is warm and dry.  Neurological:     General: No  focal deficit present.     Mental Status: She is alert and oriented to person, place, and time.  Psychiatric:        Mood and Affect: Mood normal.        Behavior: Behavior normal.         Assessment & Plan:   Annual physical exam -     Comprehensive metabolic panel  Perimenopausal atrophic vaginitis  Screening for deficiency anemia  Screening for lipid disorders -     Lipid panel  Screening for HIV (human immunodeficiency virus) -     HIV Antibody (routine testing w rflx)  Need for hepatitis C screening test -     Hepatitis C antibody  MS (multiple sclerosis) (HCC) -     CBC with Differential/Platelet  Vitamin D deficiency -     VITAMIN D 25 Hydroxy (Vit-D Deficiency, Fractures)  Prediabetes -     Hemoglobin A1c     Return in about 3 months (around 09/17/2023) for follow up.   Tommie Raymond, MD

## 2023-06-22 LAB — COMPREHENSIVE METABOLIC PANEL
ALT: 15 IU/L
AST: 16 IU/L (ref 0–40)
Albumin: 4.2 g/dL (ref 3.8–4.9)
Alkaline Phosphatase: 149 IU/L — ABNORMAL HIGH (ref 44–121)
BUN/Creatinine Ratio: 23
BUN: 18 mg/dL
Bilirubin Total: 0.2 mg/dL (ref 0.0–1.2)
CO2: 22 mmol/L (ref 20–29)
Calcium: 9.4 mg/dL
Chloride: 103 mmol/L (ref 96–106)
Creatinine, Ser: 0.78 mg/dL
Globulin, Total: 2.7 g/dL (ref 1.5–4.5)
Glucose: 88 mg/dL (ref 70–99)
Potassium: 4.9 mmol/L (ref 3.5–5.2)
Sodium: 142 mmol/L (ref 134–144)
Total Protein: 6.9 g/dL (ref 6.0–8.5)

## 2023-06-22 LAB — CBC WITH DIFFERENTIAL/PLATELET
Basophils Absolute: 0.1 10*3/uL
Basos: 1 %
EOS (ABSOLUTE): 0.1 10*3/uL
Eos: 2 %
Hematocrit: 41.6 %
Hemoglobin: 13.5 g/dL
Immature Grans (Abs): 0 10*3/uL
Immature Granulocytes: 0 %
Lymphocytes Absolute: 1.5 10*3/uL
Lymphs: 23 %
MCH: 29 pg
MCHC: 32.5 g/dL
MCV: 89 fL
Monocytes Absolute: 0.4 10*3/uL
Monocytes: 7 %
Neutrophils Absolute: 4.3 10*3/uL
Neutrophils: 67 %
Platelets: 343 10*3/uL (ref 150–450)
RBC: 4.66 x10E6/uL
RDW: 13.1 %
WBC: 6.4 10*3/uL (ref 3.4–10.8)

## 2023-06-22 LAB — LIPID PANEL W/O CHOL/HDL RATIO
Cholesterol, Total: 243 mg/dL
HDL: 58 mg/dL (ref >39)
LDL Chol Calc (NIH): 163 mg/dL
Triglycerides: 124 mg/dL
VLDL Cholesterol Cal: 22 mg/dL (ref 5–40)

## 2023-06-22 LAB — SPECIMEN STATUS REPORT

## 2023-06-22 LAB — VITAMIN D 25 HYDROXY (VIT D DEFICIENCY, FRACTURES): Vit D, 25-Hydroxy: 46.8 ng/mL (ref 30.0–100.0)

## 2023-06-22 LAB — HIV ANTIBODY (ROUTINE TESTING W REFLEX): HIV Screen 4th Generation wRfx: NONREACTIVE

## 2023-06-22 LAB — HGB A1C W/O EAG: Hgb A1c MFr Bld: 5.4 % (ref 4.8–5.6)

## 2023-07-03 ENCOUNTER — Other Ambulatory Visit: Payer: Self-pay | Admitting: Neurology

## 2023-07-03 DIAGNOSIS — F988 Other specified behavioral and emotional disorders with onset usually occurring in childhood and adolescence: Secondary | ICD-10-CM

## 2023-07-06 ENCOUNTER — Telehealth: Payer: Self-pay | Admitting: Neurology

## 2023-07-06 NOTE — Telephone Encounter (Signed)
Pt checking on refill for amphetamine-dextroamphetamine (ADDERALL XR) 30 MG 24 hr capsule

## 2023-07-07 ENCOUNTER — Other Ambulatory Visit: Payer: Self-pay | Admitting: Neurology

## 2023-07-07 DIAGNOSIS — F988 Other specified behavioral and emotional disorders with onset usually occurring in childhood and adolescence: Secondary | ICD-10-CM

## 2023-07-07 MED ORDER — AMPHETAMINE-DEXTROAMPHET ER 30 MG PO CP24
30.0000 mg | ORAL_CAPSULE | Freq: Every day | ORAL | 0 refills | Status: DC
Start: 2023-07-07 — End: 2023-09-15

## 2023-07-07 NOTE — Telephone Encounter (Signed)
Last seen 04-14-2023, last fill 06-03-2023 #30, next appt 10-19-2023.

## 2023-07-07 NOTE — Telephone Encounter (Signed)
Sent to Dr. Epimenio Foot to fill, Rx request.

## 2023-07-08 MED ORDER — AMPHETAMINE-DEXTROAMPHET ER 30 MG PO CP24
30.0000 mg | ORAL_CAPSULE | Freq: Every day | ORAL | 0 refills | Status: DC
Start: 2023-07-08 — End: 2023-08-10

## 2023-07-08 NOTE — Telephone Encounter (Signed)
Dr.Sater Rx was just filled on 07/07/23. Please deny Rx.

## 2023-07-16 ENCOUNTER — Other Ambulatory Visit: Payer: Self-pay | Admitting: Family Medicine

## 2023-07-16 NOTE — Telephone Encounter (Signed)
Requested Prescriptions  Pending Prescriptions Disp Refills   levothyroxine (SYNTHROID) 150 MCG tablet [Pharmacy Med Name: LEVOTHYROXINE 150 MCG TABLET] 90 tablet 0    Sig: TAKE 1 TABLET BY MOUTH EVERY DAY     Endocrinology:  Hypothyroid Agents Passed - 07/16/2023  1:31 AM      Passed - TSH in normal range and within 360 days    TSH  Date Value Ref Range Status  08/26/2022 4.140 0.450 - 4.500 uIU/mL Final         Passed - Valid encounter within last 12 months    Recent Outpatient Visits           4 weeks ago Annual physical exam   [facility], MD   1 year ago Hypothyroidism, unspecified type   [facility], MD   1 year ago Annual physical exam   [facility], MD   1 year ago Hypothyroidism, unspecified type   [facility], MD       Future Appointments             In 2 months Georganna Skeans, MD [facility]

## 2023-08-10 ENCOUNTER — Other Ambulatory Visit: Payer: Self-pay | Admitting: Neurology

## 2023-08-10 DIAGNOSIS — F988 Other specified behavioral and emotional disorders with onset usually occurring in childhood and adolescence: Secondary | ICD-10-CM

## 2023-08-10 MED ORDER — AMPHETAMINE-DEXTROAMPHET ER 30 MG PO CP24
30.0000 mg | ORAL_CAPSULE | Freq: Every day | ORAL | 0 refills | Status: DC
Start: 2023-08-10 — End: 2023-09-14

## 2023-08-10 NOTE — Telephone Encounter (Signed)
Last seen on 04/14/23 Follow up scheduled on 10/19/23 Last filled on 07/07/23 #30 tablets (30 day supply) Rx pending to be signed

## 2023-09-14 ENCOUNTER — Other Ambulatory Visit: Payer: Self-pay | Admitting: Neurology

## 2023-09-14 DIAGNOSIS — F988 Other specified behavioral and emotional disorders with onset usually occurring in childhood and adolescence: Secondary | ICD-10-CM

## 2023-09-15 ENCOUNTER — Ambulatory Visit: Payer: Medicare Other

## 2023-09-15 MED ORDER — AMPHETAMINE-DEXTROAMPHET ER 30 MG PO CP24
30.0000 mg | ORAL_CAPSULE | Freq: Every day | ORAL | 0 refills | Status: DC
Start: 2023-09-15 — End: 2023-10-20

## 2023-09-15 NOTE — Telephone Encounter (Signed)
Last seen on 04/14/23 Follow up scheduled 10/19/23 Last filled on 08/10/23 #30 tablets (30 day supply) Rx pending to be signed

## 2023-09-17 ENCOUNTER — Ambulatory Visit: Payer: Medicare Other | Admitting: Family Medicine

## 2023-09-17 ENCOUNTER — Encounter: Payer: Self-pay | Admitting: Family Medicine

## 2023-09-17 ENCOUNTER — Ambulatory Visit: Payer: Medicare Other

## 2023-09-17 VITALS — BP 129/82 | HR 79 | Temp 97.1°F | Resp 16 | Wt 218.4 lb

## 2023-09-17 DIAGNOSIS — F418 Other specified anxiety disorders: Secondary | ICD-10-CM

## 2023-09-17 DIAGNOSIS — N952 Postmenopausal atrophic vaginitis: Secondary | ICD-10-CM | POA: Diagnosis not present

## 2023-09-17 DIAGNOSIS — R052 Subacute cough: Secondary | ICD-10-CM | POA: Diagnosis not present

## 2023-09-17 MED ORDER — CLONAZEPAM 0.5 MG PO TABS: 0.5000 mg | ORAL_TABLET | Freq: Two times a day (BID) | ORAL | 0 refills | Status: DC | PRN

## 2023-09-17 MED ORDER — AZITHROMYCIN 250 MG PO TABS: ORAL_TABLET | ORAL | 0 refills | Status: AC

## 2023-09-17 NOTE — Progress Notes (Signed)
Established Patient Office Visit  Subjective    Patient ID: Amy Burgess, female    DOB: Feb 01, 1966  Age: 57 y.o. MRN: [id number]  CC:  Chief Complaint  Patient presents with   Medical Management of Chronic Issues    HPI Amy Burgess presents with dry cough, congestion, and fatigue for about 2 weeks. Is also having fever. Taking tylenol and advil for sx. Denies known contacts or exposures. Patient also reports situational anxiety that started on last week. Lastly, patient would like to have a compounding pharmacy for her vaginal estrogen or change to an estrogen patch for her atrophic vaginitis.   Outpatient Encounter Medications as of 09/17/2023  Medication Sig   acyclovir (ZOVIRAX) 400 MG tablet Take 1 tablet (400 mg total) by mouth daily.   albuterol (VENTOLIN HFA) 108 (90 Base) MCG/ACT inhaler Inhale 2 puffs into the lungs every 6 (six) hours as needed for wheezing or shortness of breath.   amphetamine-dextroamphetamine (ADDERALL XR) 30 MG 24 hr capsule Take 1 capsule (30 mg total) by mouth daily.   azithromycin (ZITHROMAX) 250 MG tablet Take 2 tablets on day 1, then 1 tablet daily on days 2 through 5   Cetirizine HCl (ZYRTEC ALLERGY) 10 MG CAPS Zyrtec   clonazePAM (KLONOPIN) 0.5 MG tablet Take 1 tablet (0.5 mg total) by mouth 2 (two) times daily as needed for anxiety.   conjugated estrogens (PREMARIN) vaginal cream 2 grams per applicator vaginally at HS   fluticasone (FLONASE) 50 MCG/ACT nasal spray fluticasone propionate 50 mcg/actuation nasal spray,suspension  USE 1 SPRAY NASALLY IN EACH NOSTRIL ONCE DAILY   fluticasone (FLONASE) 50 MCG/ACT nasal spray Place 2 sprays into both nostrils daily.   gabapentin (NEURONTIN) 400 MG capsule    gabapentin (NEURONTIN) 800 MG tablet Take 1 tablet (800 mg total) by mouth 2 (two) times daily.   levothyroxine (SYNTHROID) 150 MCG tablet TAKE 1 TABLET BY MOUTH EVERY DAY   ondansetron (ZOFRAN) 4 MG tablet Take 4 mg by mouth every 8  (eight) hours as needed for nausea or vomiting.   sertraline (ZOLOFT) 100 MG tablet Take 1 tablet (100 mg total) by mouth daily.   tiZANidine (ZANAFLEX) 4 MG tablet tizanidine 4 mg tablet TAKE 1 TABLET BY MOUTH THREE TIMES A DAY AS NEEDED   No facility-administered encounter medications on file as of 09/17/2023.    Past Medical History:  Diagnosis Date   ADHD (attention deficit hyperactivity disorder)    Asthma    History of kidney stones    Hypothyroidism    Multiple sclerosis (HCC)    Neuromuscular disorder (HCC)     Past Surgical History:  Procedure Laterality Date   ABDOMINAL HYSTERECTOMY     APPENDECTOMY     CESAREAN SECTION     CHOLECYSTECTOMY     EXTRACORPOREAL SHOCK WAVE LITHOTRIPSY Right 01/02/2022   Procedure: EXTRACORPOREAL SHOCK WAVE LITHOTRIPSY (ESWL);  Surgeon: Heloise Purpura, MD;  Location: [facility];  Service: Urology;  Laterality: Right;    Family History  Problem Relation Age of Onset   Heart attack Mother    Hypertension Father    Diabetes type II Father    Multiple sclerosis Paternal Grandmother     Social History   Socioeconomic History   Marital status: Divorced    Spouse name: Not on file   Number of children: Not on file   Years of education: Not on file   Highest education level: Associate degree: academic program  Occupational History  Not on file  Tobacco Use   Smoking status: Former    Current packs/day: 0.00    Types: Cigarettes    Quit date: 1985    Years since quitting: 39.9   Smokeless tobacco: Never  Vaping Use   Vaping status: Never Used  Substance and Sexual Activity   Alcohol use: Not Currently    Comment: socaially   Drug use: Never   Sexual activity: Not on file  Other Topics Concern   Not on file  Social History Narrative   Right Handed   Social Determinants of Health   Financial Resource Strain: Low Risk  (09/14/2023)   Overall Financial Resource Strain (CARDIA)    Difficulty of Paying Living  Expenses: Not very hard  Recent Concern: Financial Resource Strain - Medium Risk (06/16/2023)   Overall Financial Resource Strain (CARDIA)    Difficulty of Paying Living Expenses: Somewhat hard  Food Insecurity: No Food Insecurity (09/14/2023)   Hunger Vital Sign    Worried About Running Out of Food in the Last Year: Never true    Ran Out of Food in the Last Year: Never true  Transportation Needs: No Transportation Needs (09/14/2023)   PRAPARE - Administrator, Civil Service (Medical): No    Lack of Transportation (Non-Medical): No  Physical Activity: Unknown (09/14/2023)   Exercise Vital Sign    Days of Exercise per Week: Patient declined    Minutes of Exercise per Session: 0 min  Stress: Stress Concern Present (09/14/2023)   [employer] of Occupational Health - Occupational Stress Questionnaire    Feeling of Stress : To some extent  Social Connections: Unknown (09/14/2023)   Social Connection and Isolation Panel [NHANES]    Frequency of Communication with Friends and Family: Once a week    Frequency of Social Gatherings with Friends and Family: Patient declined    Attends Religious Services: Never    Database administrator or Organizations: No    Attends Engineer, structural: Not on file    Marital Status: Living with partner  Recent Concern: Social Connections - Moderately Isolated (06/16/2023)   Social Connection and Isolation Panel [NHANES]    Frequency of Communication with Friends and Family: Three times a week    Frequency of Social Gatherings with Friends and Family: Patient declined    Attends Religious Services: Never    Database administrator or Organizations: No    Attends Engineer, structural: Not on file    Marital Status: Living with partner  Intimate Partner Violence: Not At Risk (06/18/2023)   Humiliation, Afraid, Rape, and Kick questionnaire    Fear of Current or Ex-Partner: No    Emotionally Abused: No    Physically Abused: No     Sexually Abused: No    Review of Systems  Constitutional:  Positive for fever and malaise/fatigue. Negative for chills.  Respiratory:  Positive for cough and shortness of breath. Negative for wheezing.   All other systems reviewed and are negative.       Objective    BP 129/82   Pulse 79   Temp (!) 97.1 F (36.2 C) (Tympanic)   Resp 16   Wt 218 lb 6.4 oz (99.1 kg)   SpO2 96%   BMI 39.31 kg/m   Physical Exam Vitals and nursing note reviewed.  Constitutional:      General: She is not in acute distress.    Appearance: She is obese.  Cardiovascular:  Rate and Rhythm: Normal rate and regular rhythm.  Pulmonary:     Effort: Pulmonary effort is normal.     Breath sounds: Normal breath sounds.  Abdominal:     Palpations: Abdomen is soft.     Tenderness: There is no abdominal tenderness.  Neurological:     General: No focal deficit present.     Mental Status: She is alert and oriented to person, place, and time.         Assessment & Plan:   Senile atrophic vaginitis  Subacute cough -     DG Chest 1 View; Future  Situational anxiety  Other orders -     Azithromycin; Take 2 tablets on day 1, then 1 tablet daily on days 2 through 5  Dispense: 6 tablet; Refill: 0 -     clonazePAM; Take 1 tablet (0.5 mg total) by mouth 2 (two) times daily as needed for anxiety.  Dispense: 12 tablet; Refill: 0     No follow-ups on file.   Tommie Raymond, MD

## 2023-09-18 ENCOUNTER — Telehealth: Payer: Self-pay | Admitting: *Deleted

## 2023-09-18 NOTE — Progress Notes (Signed)
I called patient patient and did not get a answer.  I left a message on voicemail to return my call.

## 2023-09-18 NOTE — Telephone Encounter (Signed)
Patient returned call- no call note,but there is result- imaging  Patient notified and is aware: No active disease noted

## 2023-09-21 ENCOUNTER — Encounter: Payer: Self-pay | Admitting: Family Medicine

## 2023-09-24 ENCOUNTER — Other Ambulatory Visit: Payer: Medicare Other

## 2023-09-24 ENCOUNTER — Other Ambulatory Visit: Payer: Self-pay | Admitting: Family Medicine

## 2023-09-24 DIAGNOSIS — R5381 Other malaise: Secondary | ICD-10-CM

## 2023-09-25 LAB — CMP14+EGFR
ALT: 10 [IU]/L (ref 0–32)
AST: 16 [IU]/L (ref 0–40)
Albumin: 4.2 g/dL (ref 3.8–4.9)
Alkaline Phosphatase: 154 [IU]/L — ABNORMAL HIGH (ref 44–121)
BUN/Creatinine Ratio: 17 (ref 9–23)
BUN: 16 mg/dL (ref 6–24)
Bilirubin Total: 0.3 mg/dL (ref 0.0–1.2)
CO2: 21 mmol/L (ref 20–29)
Calcium: 8.9 mg/dL (ref 8.7–10.2)
Chloride: 107 mmol/L — ABNORMAL HIGH (ref 96–106)
Creatinine, Ser: 0.94 mg/dL (ref 0.57–1.00)
Globulin, Total: 2.4 g/dL (ref 1.5–4.5)
Glucose: 86 mg/dL (ref 70–99)
Potassium: 4.2 mmol/L (ref 3.5–5.2)
Sodium: 143 mmol/L (ref 134–144)
Total Protein: 6.6 g/dL (ref 6.0–8.5)
eGFR: 70 mL/min/{1.73_m2} (ref >59)

## 2023-09-25 LAB — CBC WITH DIFFERENTIAL/PLATELET
Basophils Absolute: 0.1 10*3/uL (ref 0.0–0.2)
Basos: 1 %
EOS (ABSOLUTE): 0.1 10*3/uL (ref 0.0–0.4)
Eos: 2 %
Hematocrit: 41.5 % (ref 34.0–46.6)
Hemoglobin: 13.2 g/dL (ref 11.1–15.9)
Immature Grans (Abs): 0 10*3/uL (ref 0.0–0.1)
Immature Granulocytes: 0 %
Lymphocytes Absolute: 1.3 10*3/uL (ref 0.7–3.1)
Lymphs: 26 %
MCH: 29 pg (ref 26.6–33.0)
MCHC: 31.8 g/dL (ref 31.5–35.7)
MCV: 91 fL (ref 79–97)
Monocytes Absolute: 0.4 10*3/uL (ref 0.1–0.9)
Monocytes: 8 %
Neutrophils Absolute: 3.3 10*3/uL (ref 1.4–7.0)
Neutrophils: 63 %
Platelets: 288 10*3/uL (ref 150–450)
RBC: 4.55 x10E6/uL (ref 3.77–5.28)
RDW: 13.1 % (ref 11.7–15.4)
WBC: 5.1 10*3/uL (ref 3.4–10.8)

## 2023-09-25 LAB — SPECIMEN STATUS REPORT

## 2023-09-28 NOTE — Telephone Encounter (Signed)
Patient wants to know if her parathyroid level  can be checked and what would that require? Fasting >? And also wanted to know if she needs a 24hr urine done since its been a while to chek her kidneys. Please advise

## 2023-10-01 ENCOUNTER — Ambulatory Visit (INDEPENDENT_AMBULATORY_CARE_PROVIDER_SITE_OTHER): Payer: Medicare Other | Admitting: Family Medicine

## 2023-10-01 ENCOUNTER — Encounter: Payer: Self-pay | Admitting: Family Medicine

## 2023-10-01 VITALS — BP 121/83 | HR 86 | Temp 97.7°F | Resp 16 | Ht 62.5 in | Wt 212.8 lb

## 2023-10-01 DIAGNOSIS — R5383 Other fatigue: Secondary | ICD-10-CM | POA: Diagnosis not present

## 2023-10-01 DIAGNOSIS — Z87442 Personal history of urinary calculi: Secondary | ICD-10-CM | POA: Diagnosis not present

## 2023-10-02 LAB — TSH+FREE T4
Free T4: 1.6 ng/dL (ref 0.82–1.77)
TSH: 5.11 u[IU]/mL — ABNORMAL HIGH (ref 0.450–4.500)

## 2023-10-05 ENCOUNTER — Encounter: Payer: Self-pay | Admitting: Family Medicine

## 2023-10-05 NOTE — Progress Notes (Signed)
Established Patient Office Visit  Subjective    Patient ID: Amy Burgess, female    DOB: Feb 26, 1966  Age: 57 y.o. MRN: [id number]  CC:  Chief Complaint  Patient presents with   Follow-up    2 week    HPI Amy Burgess presents with complaint of fatigue. She also reports that she has had a history of kidney stones and would like a referral for further evaluation. She is not having sx presently.   Outpatient Encounter Medications as of 10/01/2023  Medication Sig   acyclovir (ZOVIRAX) 400 MG tablet Take 1 tablet (400 mg total) by mouth daily.   albuterol (VENTOLIN HFA) 108 (90 Base) MCG/ACT inhaler Inhale 2 puffs into the lungs every 6 (six) hours as needed for wheezing or shortness of breath.   amphetamine-dextroamphetamine (ADDERALL XR) 30 MG 24 hr capsule Take 1 capsule (30 mg total) by mouth daily.   Cetirizine HCl (ZYRTEC ALLERGY) 10 MG CAPS Zyrtec   clonazePAM (KLONOPIN) 0.5 MG tablet Take 1 tablet (0.5 mg total) by mouth 2 (two) times daily as needed for anxiety.   conjugated estrogens (PREMARIN) vaginal cream 2 grams per applicator vaginally at HS   fluticasone (FLONASE) 50 MCG/ACT nasal spray fluticasone propionate 50 mcg/actuation nasal spray,suspension  USE 1 SPRAY NASALLY IN EACH NOSTRIL ONCE DAILY   fluticasone (FLONASE) 50 MCG/ACT nasal spray Place 2 sprays into both nostrils daily.   gabapentin (NEURONTIN) 800 MG tablet Take 1 tablet (800 mg total) by mouth 2 (two) times daily.   levothyroxine (SYNTHROID) 150 MCG tablet TAKE 1 TABLET BY MOUTH EVERY DAY   ondansetron (ZOFRAN) 4 MG tablet Take 4 mg by mouth every 8 (eight) hours as needed for nausea or vomiting.   sertraline (ZOLOFT) 100 MG tablet Take 1 tablet (100 mg total) by mouth daily.   tiZANidine (ZANAFLEX) 4 MG tablet tizanidine 4 mg tablet TAKE 1 TABLET BY MOUTH THREE TIMES A DAY AS NEEDED   [DISCONTINUED] gabapentin (NEURONTIN) 400 MG capsule    No facility-administered encounter medications on  file as of 10/01/2023.    Past Medical History:  Diagnosis Date   ADHD (attention deficit hyperactivity disorder)    Asthma    History of kidney stones    Hypothyroidism    Multiple sclerosis (HCC)    Neuromuscular disorder (HCC)     Past Surgical History:  Procedure Laterality Date   ABDOMINAL HYSTERECTOMY     APPENDECTOMY     CESAREAN SECTION     CHOLECYSTECTOMY     EXTRACORPOREAL SHOCK WAVE LITHOTRIPSY Right 01/02/2022   Procedure: EXTRACORPOREAL SHOCK WAVE LITHOTRIPSY (ESWL);  Surgeon: Heloise Purpura, MD;  Location: [facility];  Service: Urology;  Laterality: Right;    Family History  Problem Relation Age of Onset   Heart attack Mother    Hypertension Father    Diabetes type II Father    Multiple sclerosis Paternal Grandmother     Social History   Socioeconomic History   Marital status: Divorced    Spouse name: Not on file   Number of children: Not on file   Years of education: Not on file   Highest education level: Associate degree: academic program  Occupational History   Not on file  Tobacco Use   Smoking status: Former    Current packs/day: 0.00    Types: Cigarettes    Quit date: 1985    Years since quitting: 40.0   Smokeless tobacco: Never  Vaping Use   Vaping status:  Never Used  Substance and Sexual Activity   Alcohol use: Not Currently    Comment: socaially   Drug use: Never   Sexual activity: Not on file  Other Topics Concern   Not on file  Social History Narrative   Right Handed   Social Drivers of Health   Financial Resource Strain: Low Risk  (09/28/2023)   Overall Financial Resource Strain (CARDIA)    Difficulty of Paying Living Expenses: Not very hard  Food Insecurity: No Food Insecurity (09/28/2023)   Hunger Vital Sign    Worried About Running Out of Food in the Last Year: Never true    Ran Out of Food in the Last Year: Never true  Transportation Needs: No Transportation Needs (09/28/2023)   PRAPARE - Therapist, art (Medical): No    Lack of Transportation (Non-Medical): No  Physical Activity: Insufficiently Active (09/28/2023)   Exercise Vital Sign    Days of Exercise per Week: 2 days    Minutes of Exercise per Session: 10 min  Stress: Stress Concern Present (09/28/2023)   [employer] of Occupational Health - Occupational Stress Questionnaire    Feeling of Stress : To some extent  Social Connections: Unknown (09/28/2023)   Social Connection and Isolation Panel [NHANES]    Frequency of Communication with Friends and Family: Three times a week    Frequency of Social Gatherings with Friends and Family: Once a week    Attends Religious Services: Patient declined    Database administrator or Organizations: No    Attends Engineer, structural: Not on file    Marital Status: Living with partner  Intimate Partner Violence: Not At Risk (06/18/2023)   Humiliation, Afraid, Rape, and Kick questionnaire    Fear of Current or Ex-Partner: No    Emotionally Abused: No    Physically Abused: No    Sexually Abused: No    Review of Systems  All other systems reviewed and are negative.       Objective    BP 121/83 (BP Location: Right Arm, Patient Position: Sitting, Cuff Size: Large)   Pulse 86   Temp 97.7 F (36.5 C) (Oral)   Resp 16   Ht 5' 2.5" (1.588 m)   Wt 212 lb 12.8 oz (96.5 kg)   SpO2 98%   BMI 38.30 kg/m   Physical Exam Vitals and nursing note reviewed.  Constitutional:      General: She is not in acute distress.    Appearance: She is obese.  Cardiovascular:     Rate and Rhythm: Normal rate and regular rhythm.  Pulmonary:     Effort: Pulmonary effort is normal.     Breath sounds: Normal breath sounds.  Abdominal:     Palpations: Abdomen is soft.     Tenderness: There is no abdominal tenderness.  Neurological:     General: No focal deficit present.     Mental Status: She is alert and oriented to person, place, and time.  Psychiatric:         Mood and Affect: Mood is anxious.         Assessment & Plan:   Other fatigue -     TSH + free T4  History of kidney stones -     Ambulatory referral to Nephrology     Return if symptoms worsen or fail to improve.   Tommie Raymond, MD

## 2023-10-09 ENCOUNTER — Other Ambulatory Visit: Payer: Self-pay | Admitting: Family Medicine

## 2023-10-09 DIAGNOSIS — Z87442 Personal history of urinary calculi: Secondary | ICD-10-CM

## 2023-10-09 DIAGNOSIS — R7989 Other specified abnormal findings of blood chemistry: Secondary | ICD-10-CM

## 2023-10-16 NOTE — Progress Notes (Deleted)
 [facility]  PATIENT: Amy Burgess DOB: [birth year]  REFERRING DOCTOR OR PCP: Raguel Blush, MD SOURCE: Patient, notes from primary care,  _________________________________   HISTORICAL  CHIEF COMPLAINT:  No chief complaint on file.   HISTORY OF PRESENT ILLNESS:  Amy Burgess is a 58 y.o. woman who was diagnosed with multiple sclerosis in 2015.     Update 04/14/2023: Returns for follow-up visit.  Previously seen by Dr. Vear on 04/14/2023. She denies any new neurologic symptoms though she reports progressive issues with her right leg.    Last MRis from 2023 show:   The spinal cord was normal.  The brain shows a small number of T2/FLAIR hyperintense foci.  Most were nonspecific, 1 was periventricular.  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  She has fluctuating painful dysesthesias in her feet.  Gait is sometimes off.   Without her cane, she walks around he house but feels off balanced and had a recent fall in an open area of the house.     She uses the bannister on stairs  She uses a cane intermittently.   She felt she did well April 2024 and was able to go 1/4 mile without the cane.   She notes weakness in legs and numbness in feet with some pain.    She takes tizanidine  up to 4 mg po tid (varies dose) for spasticity.  She is on gabapentin  400 mg po tid to qid prn  or similar (often 400-400-800)    She has some urinary incontinence and uses pads.   Ditropan dried her out so she prefers not to be on one.   Vision is mildly off.  She has fatigue, helped by Adderall .    She sleeps well at night.   She does not snore.  She notes cognitive issues with brain fog and poor focus.    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         She is on Adderall  XR 30 mg po every day and it also helps her cognitive issues.     Mood is better on sertraline .      History of MS diagnosis: She was  diagnosed with MS in 2005.   She had balance issues and slurred speech.  She had a positive FH of MS (her paternal grandmother).    She was placed on Avonex but developed antibodies.    She saw Dr. Dr. Caprice in [place], [place] . MRi was reportedly c/w MS.  She also had a lumbar puncture.   She was in the [facility] trial (Dr. Caprice) and had treatments in 2009, 2010 and 2018.   She had frequent  MRI as part of the trial and reports she had progression.SABRA   She was on Gilenya briefly in 2017.   Due to new onset vertigo, she had an MRI and was told she had no new lesions at that time.   MRI in 2023 showed low plaque burden in brain and no spinal cord lesions.  .      Her last brain MRI was performed 01/02/2021 in [place].  She was able to pull up the results on her phone.  We do not have the images.  It was read as showing tiny infarcts in cerebellum and non-specific white matter changes but no acute findings.   It was performed at [facility]  in [place], [place]     (Phone [phone] /  Fax [phone]).  We will try to get the images.  She is a retired Film/video Editor.     IMAGING: MRI of the brain 10/30/2021 showed several small T2/FLAIR hyperintense foci in the hemispheres.  These were nonspecific but could be consistent with demyelination or chronic microvascular ischemic change..  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  MRI of the thoracic spine 11/02/2021 showed a normal spinal cord and no significant degenerative changes  REVIEW OF SYSTEMS: Constitutional: No fevers, chills, sweats, or change in appetite.  She has fatigue. Eyes: No visual changes, double vision, eye pain Ear, nose and throat: No hearing loss, ear pain, nasal congestion, sore throat Cardiovascular: No chest pain,  palpitations Respiratory:  No shortness of breath at rest or with exertion.   No wheezes GastrointestinaI: No nausea, vomiting, diarrhea, abdominal pain, fecal incontinence Genitourinary:  No dysuria, urinary retention or frequency.  No nocturia. Musculoskeletal:  No neck pain, back pain Integumentary: No rash, pruritus, skin lesions Neurological: as above Psychiatric: No depression at this time.  No anxiety Endocrine: No palpitations, diaphoresis, change in appetite, change in weigh or increased thirst Hematologic/Lymphatic:  No anemia, purpura, petechiae. Allergic/Immunologic: No itchy/runny eyes, nasal congestion, recent allergic reactions, rashes  ALLERGIES: Allergies  Allergen Reactions   Iodinated Contrast Media Other (See Comments)    HOME MEDICATIONS:  Current Outpatient Medications:    acyclovir  (ZOVIRAX ) 400 MG tablet, Take 1 tablet (400 mg total) by mouth daily., Disp: 90 tablet, Rfl: 3   albuterol  (VENTOLIN  HFA) 108 (90 Base) MCG/ACT inhaler, Inhale 2 puffs into the lungs every 6 (six) hours as needed for wheezing or shortness of breath., Disp: 8 g, Rfl: 0   amphetamine -dextroamphetamine (ADDERALL  XR) 30 MG 24 hr capsule, Take 1 capsule (30 mg total) by mouth daily., Disp: 30 capsule, Rfl: 0   Cetirizine HCl (ZYRTEC ALLERGY) 10 MG CAPS, Zyrtec, Disp: , Rfl:    clonazePAM  (KLONOPIN ) 0.5 MG tablet, Take 1 tablet (0.5 mg total) by mouth 2 (two) times daily as needed for anxiety., Disp: 12 tablet, Rfl: 0   conjugated estrogens  (PREMARIN ) vaginal cream, 2 grams per applicator vaginally at HS, Disp: 43 g, Rfl: 3   fluticasone  (FLONASE ) 50 MCG/ACT nasal spray, fluticasone  propionate 50 mcg/actuation nasal spray,suspension  USE 1 SPRAY NASALLY IN EACH NOSTRIL ONCE DAILY, Disp: , Rfl:    fluticasone  (FLONASE ) 50 MCG/ACT nasal spray, Place 2 sprays into both nostrils daily., Disp: 16 g, Rfl: 6   gabapentin  (NEURONTIN ) 800 MG tablet, Take 1 tablet (800 mg total) by mouth 2 (two) times  daily., Disp: 180 tablet, Rfl: 3   levothyroxine  (SYNTHROID ) 150 MCG tablet, TAKE 1 TABLET BY MOUTH EVERY DAY, Disp: 90 tablet, Rfl: 0   ondansetron (ZOFRAN) 4 MG tablet, Take 4 mg by mouth every 8 (eight) hours as needed for nausea or vomiting., Disp: , Rfl:    sertraline  (ZOLOFT ) 100 MG tablet, Take 1 tablet (100 mg total) by mouth daily., Disp: 90 tablet, Rfl: 3   tiZANidine  (ZANAFLEX ) 4 MG tablet, tizanidine  4 mg tablet TAKE 1 TABLET BY MOUTH THREE TIMES A DAY AS NEEDED, Disp: 270 tablet, Rfl: 3  PAST MEDICAL HISTORY: Past Medical History:  Diagnosis Date   ADHD (attention deficit hyperactivity disorder)    Asthma    History of kidney stones    Hypothyroidism  Multiple sclerosis (HCC)    Neuromuscular disorder (HCC)     PAST SURGICAL HISTORY: Past Surgical History:  Procedure Laterality Date   ABDOMINAL HYSTERECTOMY     APPENDECTOMY     CESAREAN SECTION     CHOLECYSTECTOMY     EXTRACORPOREAL SHOCK WAVE LITHOTRIPSY Right 01/02/2022   Procedure: EXTRACORPOREAL SHOCK WAVE LITHOTRIPSY (ESWL);  Surgeon: Renda Glance, MD;  Location: [facility];  Service: Urology;  Laterality: Right;    FAMILY HISTORY: Family History  Problem Relation Age of Onset   Heart attack Mother    Hypertension Father    Diabetes type II Father    Multiple sclerosis Paternal Grandmother     SOCIAL HISTORY:  Social History   Socioeconomic History   Marital status: Divorced    Spouse name: Not on file   Number of children: Not on file   Years of education: Not on file   Highest education level: Associate degree: academic program  Occupational History   Not on file  Tobacco Use   Smoking status: Former    Current packs/day: 0.00    Types: Cigarettes    Quit date: 1985    Years since quitting: 40.0   Smokeless tobacco: Never  Vaping Use   Vaping status: Never Used  Substance and Sexual Activity   Alcohol use: Not Currently    Comment: socaially   Drug use: Never    Sexual activity: Not on file  Other Topics Concern   Not on file  Social History Narrative   Right Handed   Social Drivers of Health   Financial Resource Strain: Low Risk  (09/28/2023)   Overall Financial Resource Strain (CARDIA)    Difficulty of Paying Living Expenses: Not very hard  Food Insecurity: No Food Insecurity (09/28/2023)   Hunger Vital Sign    Worried About Running Out of Food in the Last Year: Never true    Ran Out of Food in the Last Year: Never true  Transportation Needs: No Transportation Needs (09/28/2023)   PRAPARE - Administrator, Civil Service (Medical): No    Lack of Transportation (Non-Medical): No  Physical Activity: Insufficiently Active (09/28/2023)   Exercise Vital Sign    Days of Exercise per Week: 2 days    Minutes of Exercise per Session: 10 min  Stress: Stress Concern Present (09/28/2023)   [employer] of Occupational Health - Occupational Stress Questionnaire    Feeling of Stress : To some extent  Social Connections: Unknown (09/28/2023)   Social Connection and Isolation Panel [NHANES]    Frequency of Communication with Friends and Family: Three times a week    Frequency of Social Gatherings with Friends and Family: Once a week    Attends Religious Services: Patient declined    Database Administrator or Organizations: No    Attends Engineer, Structural: Not on file    Marital Status: Living with partner  Intimate Partner Violence: Not At Risk (06/18/2023)   Humiliation, Afraid, Rape, and Kick questionnaire    Fear of Current or Ex-Partner: No    Emotionally Abused: No    Physically Abused: No    Sexually Abused: No     PHYSICAL EXAM  There were no vitals filed for this visit.   There is no height or weight on file to calculate BMI.   General: The patient is well-developed and well-nourished and in no acute distress  HEENT:  Head is /AT.  Sclera are anicteric.  Skin: Extremities are without rash or   edema.   Neurologic Exam  Mental status: The patient is alert and oriented x 3 at the time of the examination. The patient has apparent normal recent and remote memory, with an apparently normal attention span and concentration ability.   Speech is normal.  Cranial nerves: Extraocular movements are full.  Facial strength and sensation was normal..  Trapezius and sternocleidomastoid strength is normal. No dysarthria is noted.   No obvious hearing deficits are noted.  Motor:  Muscle bulk is normal.  Tone difficult to test in right leg ad she pulls back as leg moved.. Strength is  5 / 5 in all 4 extremities except 4+/5 right iliopsoas.   Sensory: Sensory testing is intact to pinprick, soft touch and vibration sensation in amrs.   She reports reduced right leg vibration sensation.    Coordination: Cerebellar testing reveals good finger-nose-finger.    Gait and station: Station is normal.   The gait has a slight limp but no definite foot drop  The tandem gait is wide.   Tandem gait is normal. Romberg is borderline.   Reflexes: Deep tendon reflexes are symmetric and normal in arms and increased right ankle.          ASSESSMENT AND PLAN  No diagnosis found.   She has been diagnosed with MS and was on alemtuzumab.  MRI 10/2021 showed no spinal plaques and a fairly low number of brain lesions.  Therefore, she will continue off of a DMT.  Out on Tuesday Mab can have benefit for many years.  Given her age, it is unlikely that she will need to go back on a disease modifying therapy. Will order repeat MRI brain, *** Continue Adderall .    Continue sertraline . Disability papers filled out.  Impairments include right leg spasticity, reduced gait and fatigue Rtc 6 months   sooner I new or worsening neurologic issues     I spent *** minutes of face-to-face and non-face-to-face time with patient.  This included previsit chart review, lab review, study review, order entry, electronic health record  documentation, patient education and discussion regarding above diagnoses and treatment plan and answered all other questions to patient's satisfaction  Harlene Bogaert, [facility]  [facility] [address] [place], [place] [address]  Phone [phone] Fax [phone] Note: This document was prepared with digital dictation and possible smart phrase technology. Any transcriptional errors that result from this process are unintentional.

## 2023-10-18 ENCOUNTER — Telehealth: Payer: Self-pay | Admitting: Neurology

## 2023-10-18 ENCOUNTER — Other Ambulatory Visit: Payer: Self-pay | Admitting: Neurology

## 2023-10-18 DIAGNOSIS — F988 Other specified behavioral and emotional disorders with onset usually occurring in childhood and adolescence: Secondary | ICD-10-CM

## 2023-10-18 NOTE — Telephone Encounter (Signed)
 Called the pt in regard to apt for tomorrow am, there was no answer. LVM advising that due to the weather our office will be opening late and that we will need to reschedule the appt. Instructed the patient to call back after 10 am tomorrow for us  to get rescheduled.

## 2023-10-19 ENCOUNTER — Ambulatory Visit: Payer: Medicare Other | Admitting: Adult Health

## 2023-10-19 NOTE — Progress Notes (Signed)
 [facility]  PATIENT: Amy Burgess DOB: 02/22/1966  REFERRING DOCTOR OR PCP: Raguel Blush, MD SOURCE: Patient, notes from primary care,  _________________________________   HISTORICAL  CHIEF COMPLAINT:  Chief Complaint  Patient presents with   Follow-up    Patient in room #8 and alone. Patient states she here to follow up with her MS and needing refills on her ADD Rx. Patient states she has been feel tried and exhaustion.    HISTORY OF PRESENT ILLNESS:  Amy Burgess is a 58 y.o. woman who was diagnosed with multiple sclerosis in 2015.     Update 10/20/2023 JM: Returns for follow-up visit.  Previously seen by Dr. Vear on 04/14/2023.  She denies any new neurologic symptoms.  Last MRis from 2023 show:   The spinal cord was normal.  The brain shows a small number of T2/FLAIR hyperintense foci.  Most were nonspecific, 1 was periventricular.  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  She has fluctuating painful dysesthesias in her feet.  Gait is sometimes off.  Without her cane, she walks around he house but can feel off balanced.   She uses the bannister on stairs, did have a couple falls while walking up her stairs, thankfully no injury.  She uses a cane intermittently.   She felt she did well April 2024 and was able to go 1/4 mile without the cane.   She notes weakness in legs and numbness in feet with some pain.  She takes tizanidine  up to 4 mg po tid (varies dose) for spasticity.  She is on gabapentin  400 mg po tid to qid prn or similar (often 400-400-800). She has been trying to keep active as tolerated and trying to eat a healthier diet  She has some urinary incontinence and uses pads.   Ditropan dried her out so she prefers not to be on one.     Vision is mildly off, some blurred vision which can be worse at night, is overdue to vision exam  She has fatigue, helped by Adderall .  She sleeps well at night.   She does not  snore.  She notes cognitive issues with brain fog and poor focus.    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 She is on Adderall  XR 30 mg po every day and it also helps her cognitive issues. She will be seeing endocrinology in a couple weeks due to persistent elevated TSH and ongoing fatigue concerns.  Mood is better on sertraline  100mg  daily.        History of MS diagnosis: She was diagnosed with MS in 2005.   She had balance issues and slurred speech.  She had a positive FH of MS (her paternal grandmother).    She was placed on Avonex but developed antibodies.    She saw Dr. Dr. Caprice in [place], [place] . MRi was reportedly c/w MS.  She also had a lumbar puncture.   She was in the [facility] trial (Dr. Caprice) and had treatments in 2009, 2010 and 2018.   She had frequent  MRI as part of the trial and reports she had progression.SABRA   She was on Gilenya briefly in 2017.   Due to new onset vertigo, she had an MRI and was told she had no new lesions at that time.   MRI in 2023 showed low plaque burden in brain and no spinal cord lesions.  SABRA  Her last brain MRI was performed 01/02/2021 in [place].  She was able to pull up the results on her phone.  We do not have the images.  It was read as showing tiny infarcts in cerebellum and non-specific white matter changes but no acute findings.   It was performed at [facility]  in [place], [place]     (Phone [phone] / Fax [phone]).  We will try to get the images.  She is a retired Film/video Editor.     IMAGING: MRI of the brain 10/30/2021 showed several small T2/FLAIR hyperintense foci in the hemispheres.  These were nonspecific but could be consistent with demyelination or chronic microvascular ischemic change..  There was a chronic lacunar infarction of the right cerebral hemisphere  MRI of the cervical spine 10/30/2021 shows a normal spinal cord and mild multilevel degenerative changes but no  spinal stenosis or nerve root compression.  MRI of the thoracic spine 11/02/2021 showed a normal spinal cord and no significant degenerative changes  REVIEW OF SYSTEMS: Constitutional: No fevers, chills, sweats, or change in appetite.  She has fatigue. Eyes: No visual changes, double vision, eye pain Ear, nose and throat: No hearing loss, ear pain, nasal congestion, sore throat Cardiovascular: No chest pain, palpitations Respiratory:  No shortness of breath at rest or with exertion.   No wheezes GastrointestinaI: No nausea, vomiting, diarrhea, abdominal pain, fecal incontinence Genitourinary:  No dysuria, urinary retention or frequency.  No nocturia. Musculoskeletal:  No neck pain, back pain Integumentary: No rash, pruritus, skin lesions Neurological: as above Psychiatric: No depression at this time.  No anxiety Endocrine: No palpitations, diaphoresis, change in appetite, change in weigh or increased thirst Hematologic/Lymphatic:  No anemia, purpura, petechiae. Allergic/Immunologic: No itchy/runny eyes, nasal congestion, recent allergic reactions, rashes  ALLERGIES: Allergies  Allergen Reactions   Iodinated Contrast Media Other (See Comments)    HOME MEDICATIONS:  Current Outpatient Medications:    acyclovir  (ZOVIRAX ) 400 MG tablet, Take 1 tablet (400 mg total) by mouth daily., Disp: 90 tablet, Rfl: 3   albuterol  (VENTOLIN  HFA) 108 (90 Base) MCG/ACT inhaler, Inhale 2 puffs into the lungs every 6 (six) hours as needed for wheezing or shortness of breath., Disp: 8 g, Rfl: 0   clonazePAM  (KLONOPIN ) 0.5 MG tablet, Take 1 tablet (0.5 mg total) by mouth 2 (two) times daily as needed for anxiety., Disp: 12 tablet, Rfl: 0   conjugated estrogens  (PREMARIN ) vaginal cream, 2 grams per applicator vaginally at HS, Disp: 43 g, Rfl: 3   levothyroxine  (SYNTHROID ) 150 MCG tablet, TAKE 1 TABLET BY MOUTH EVERY DAY, Disp: 90 tablet, Rfl: 0   ondansetron (ZOFRAN) 4 MG tablet, Take 4 mg by mouth every 8  (eight) hours as needed for nausea or vomiting., Disp: , Rfl:    amphetamine -dextroamphetamine (ADDERALL  XR) 30 MG 24 hr capsule, Take 1 capsule (30 mg total) by mouth daily., Disp: 30 capsule, Rfl: 0   gabapentin  (NEURONTIN ) 800 MG tablet, Take 1 tablet (800 mg total) by mouth 2 (two) times daily., Disp: 180 tablet, Rfl: 3   sertraline  (ZOLOFT ) 100 MG tablet, Take 1 tablet (100 mg total) by mouth daily., Disp: 90 tablet, Rfl: 3   tiZANidine  (ZANAFLEX ) 4 MG tablet, tizanidine  4 mg tablet TAKE 1 TABLET BY MOUTH THREE TIMES A DAY AS NEEDED, Disp: 270 tablet, Rfl: 3  PAST MEDICAL HISTORY: Past Medical History:  Diagnosis Date   ADHD (attention deficit hyperactivity disorder)    Asthma    History of kidney stones  Hypothyroidism    Multiple sclerosis (HCC)    Neuromuscular disorder (HCC)     PAST SURGICAL HISTORY: Past Surgical History:  Procedure Laterality Date   ABDOMINAL HYSTERECTOMY     APPENDECTOMY     CESAREAN SECTION     CHOLECYSTECTOMY     EXTRACORPOREAL SHOCK WAVE LITHOTRIPSY Right 01/02/2022   Procedure: EXTRACORPOREAL SHOCK WAVE LITHOTRIPSY (ESWL);  Surgeon: Renda Glance, MD;  Location: [facility];  Service: Urology;  Laterality: Right;    FAMILY HISTORY: Family History  Problem Relation Age of Onset   Heart attack Mother    Hypertension Father    Diabetes type II Father    Multiple sclerosis Paternal Grandmother     SOCIAL HISTORY:  Social History   Socioeconomic History   Marital status: Divorced    Spouse name: Not on file   Number of children: Not on file   Years of education: Not on file   Highest education level: Associate degree: academic program  Occupational History   Not on file  Tobacco Use   Smoking status: Former    Current packs/day: 0.00    Types: Cigarettes    Quit date: 1985    Years since quitting: 40.0   Smokeless tobacco: Never  Vaping Use   Vaping status: Never Used  Substance and Sexual Activity   Alcohol use:  Not Currently    Comment: socaially   Drug use: Never   Sexual activity: Not on file  Other Topics Concern   Not on file  Social History Narrative   Right Handed   Social Drivers of Health   Financial Resource Strain: Low Risk  (09/28/2023)   Overall Financial Resource Strain (CARDIA)    Difficulty of Paying Living Expenses: Not very hard  Food Insecurity: No Food Insecurity (09/28/2023)   Hunger Vital Sign    Worried About Running Out of Food in the Last Year: Never true    Ran Out of Food in the Last Year: Never true  Transportation Needs: No Transportation Needs (09/28/2023)   PRAPARE - Administrator, Civil Service (Medical): No    Lack of Transportation (Non-Medical): No  Physical Activity: Insufficiently Active (09/28/2023)   Exercise Vital Sign    Days of Exercise per Week: 2 days    Minutes of Exercise per Session: 10 min  Stress: Stress Concern Present (09/28/2023)   [employer] of Occupational Health - Occupational Stress Questionnaire    Feeling of Stress : To some extent  Social Connections: Unknown (09/28/2023)   Social Connection and Isolation Panel [NHANES]    Frequency of Communication with Friends and Family: Three times a week    Frequency of Social Gatherings with Friends and Family: Once a week    Attends Religious Services: Patient declined    Database Administrator or Organizations: No    Attends Engineer, Structural: Not on file    Marital Status: Living with partner  Intimate Partner Violence: Not At Risk (06/18/2023)   Humiliation, Afraid, Rape, and Kick questionnaire    Fear of Current or Ex-Partner: No    Emotionally Abused: No    Physically Abused: No    Sexually Abused: No     PHYSICAL EXAM  Vitals:   10/20/23 0824  BP: 134/84  Pulse: 77  Weight: 216 lb (98 kg)  Height: 5' 2 (1.575 m)     Body mass index is 39.51 kg/m.   General: The patient is well-developed and well-nourished and  in no acute  distress  HEENT:  Head is [place]/AT.  Sclera are anicteric.     Skin: Extremities are without rash or  edema.   Neurologic Exam  Mental status: The patient is alert and oriented x 3 at the time of the examination. The patient has apparent normal recent and remote memory, with an apparently normal attention span and concentration ability.   Speech is normal.  Cranial nerves: Extraocular movements are full.  Facial strength and sensation was normal..  Trapezius and sternocleidomastoid strength is normal. No dysarthria is noted.   No obvious hearing deficits are noted.  Motor:  Muscle bulk is normal.  Tone difficult to test in right leg due to pain. Strength is  5 / 5 in all 4 extremities except 4+/5 right iliopsoas.   Sensory: Sensory testing is intact to pinprick, soft touch and vibration sensation in amrs.   She reports reduced right leg vibration sensation.    Coordination: Cerebellar testing reveals good finger-nose-finger.     Gait and station: Station is normal.   The gait has a slight limp but no definite foot drop  The tandem gait is wide.  Ambulating with a cane.    Reflexes: Deep tendon reflexes are symmetric and normal in arms and increased right ankle.          ASSESSMENT AND PLAN  Attention deficit disorder (ADD) without hyperactivity - Plan: amphetamine -dextroamphetamine (ADDERALL  XR) 30 MG 24 hr capsule  MS (multiple sclerosis) (HCC) - Plan: gabapentin  (NEURONTIN ) 800 MG tablet, sertraline  (ZOLOFT ) 100 MG tablet, tiZANidine  (ZANAFLEX ) 4 MG tablet, MR BRAIN W WO CONTRAST  Depression, unspecified depression type - Plan: sertraline  (ZOLOFT ) 100 MG tablet   She has been diagnosed with MS and was on alemtuzumab.  MRI 10/2021 showed no spinal plaques and a fairly low number of brain lesions.  Therefore, she will continue off of a DMT.  Out on Tuesday Mab can have benefit for many years.  Given her age, it is unlikely that she will need to go back on a disease modifying therapy.  Will order repeat MRI brain to assess for any subclinical progression Continue Adderall  XR 30mg  daily - refill provided, last filled 09/15/2023.     Continue sertraline  100 mg daily.  Refill provided Continue gabapentin  and tizanidine , refills provided Assisting with disability, impairments include right leg spasticity, reduced gait and fatigue Rtc 6 months   sooner I new or worsening neurologic issues       I spent 30 minutes of face-to-face and non-face-to-face time with patient.  This included previsit chart review, lab review, study review, order entry, electronic health record documentation, patient education and discussion regarding above diagnoses and treatment plan and answered all other questions to patient's satisfaction  Harlene Bogaert, [facility]  [facility] [address] [place], [place] [address]  Phone [phone] Fax [phone] Note: This document was prepared with digital dictation and possible smart phrase technology. Any transcriptional errors that result from this process are unintentional.

## 2023-10-20 ENCOUNTER — Encounter: Payer: Self-pay | Admitting: Adult Health

## 2023-10-20 ENCOUNTER — Ambulatory Visit (INDEPENDENT_AMBULATORY_CARE_PROVIDER_SITE_OTHER): Payer: Medicare Other | Admitting: Adult Health

## 2023-10-20 ENCOUNTER — Telehealth: Payer: Self-pay | Admitting: Adult Health

## 2023-10-20 VITALS — BP 134/84 | HR 77 | Ht 62.0 in | Wt 216.0 lb

## 2023-10-20 DIAGNOSIS — F988 Other specified behavioral and emotional disorders with onset usually occurring in childhood and adolescence: Secondary | ICD-10-CM | POA: Diagnosis not present

## 2023-10-20 DIAGNOSIS — F32A Depression, unspecified: Secondary | ICD-10-CM | POA: Diagnosis not present

## 2023-10-20 DIAGNOSIS — G35 Multiple sclerosis: Secondary | ICD-10-CM

## 2023-10-20 MED ORDER — AMPHETAMINE-DEXTROAMPHET ER 30 MG PO CP24: 30.0000 mg | ORAL_CAPSULE | Freq: Every day | ORAL | 0 refills | Status: DC

## 2023-10-20 MED ORDER — SERTRALINE HCL 100 MG PO TABS: 100.0000 mg | ORAL_TABLET | Freq: Every day | ORAL | 3 refills | Status: DC

## 2023-10-20 MED ORDER — GABAPENTIN 800 MG PO TABS: 800.0000 mg | ORAL_TABLET | Freq: Two times a day (BID) | ORAL | 3 refills | Status: DC

## 2023-10-20 MED ORDER — TIZANIDINE HCL 4 MG PO TABS: ORAL_TABLET | ORAL | 3 refills | Status: DC

## 2023-10-20 NOTE — Telephone Encounter (Signed)
 no auth required sent to GI [phone]

## 2023-10-20 NOTE — Patient Instructions (Addendum)
 Your Plan:  Continue current medications at current dosages   You will be called to schedule an MRI brain    Follow up in 6 months or call earlier if needed     Thank you for coming to see us  at [facility]. I hope we have been able to provide you high quality care today.  You may receive a patient satisfaction survey over the next few weeks. We would appreciate your feedback and comments so that we may continue to improve ourselves and the health of our patients.

## 2023-10-28 ENCOUNTER — Other Ambulatory Visit: Payer: Self-pay | Admitting: *Deleted

## 2023-10-28 ENCOUNTER — Other Ambulatory Visit: Payer: Self-pay | Admitting: Family Medicine

## 2023-10-28 ENCOUNTER — Other Ambulatory Visit: Payer: Self-pay | Admitting: Adult Health

## 2023-10-28 DIAGNOSIS — G35 Multiple sclerosis: Secondary | ICD-10-CM

## 2023-10-28 DIAGNOSIS — R7989 Other specified abnormal findings of blood chemistry: Secondary | ICD-10-CM

## 2023-10-28 DIAGNOSIS — Z8619 Personal history of other infectious and parasitic diseases: Secondary | ICD-10-CM

## 2023-10-28 NOTE — Progress Notes (Unsigned)
 tsh

## 2023-10-29 NOTE — Telephone Encounter (Signed)
Last seen on 10/19/22 Follow up scheduled on 04/18/24

## 2023-11-26 ENCOUNTER — Other Ambulatory Visit: Payer: Self-pay | Admitting: Adult Health

## 2023-11-26 ENCOUNTER — Ambulatory Visit: Payer: Medicare Other

## 2023-11-26 DIAGNOSIS — Z0000 Encounter for general adult medical examination without abnormal findings: Secondary | ICD-10-CM | POA: Diagnosis not present

## 2023-11-26 DIAGNOSIS — F988 Other specified behavioral and emotional disorders with onset usually occurring in childhood and adolescence: Secondary | ICD-10-CM

## 2023-11-26 MED ORDER — AMPHETAMINE-DEXTROAMPHET ER 30 MG PO CP24
30.0000 mg | ORAL_CAPSULE | Freq: Every day | ORAL | 0 refills | Status: DC
Start: 2023-11-26 — End: 2023-12-16

## 2023-11-26 NOTE — Telephone Encounter (Signed)
Last seen on 10/20/23 Follow up scheduled on 04/18/24 Last filled on 10/20/23 #30 tablets  Rx pending to be signed

## 2023-11-26 NOTE — Patient Instructions (Signed)
Amy Burgess , Thank you for taking time to come for your Medicare Wellness Visit. I appreciate your ongoing commitment to your health goals. Please review the following plan we discussed and let me know if I can assist you in the future.   Referrals/Orders/Follow-Ups/Clinician Recommendations: none  This is a list of the screening recommended for you and due dates:  Health Maintenance  Topic Date Due   Hepatitis C Screening  Never done   Pap with HPV screening  Never done   Zoster (Shingles) Vaccine (1 of 2) Never done   COVID-19 Vaccine (2 - 2024-25 season) 06/14/2023   Colon Cancer Screening  10/12/2024*   Medicare Annual Wellness Visit  11/25/2024   Mammogram  06/15/2025   HIV Screening  Completed   HPV Vaccine  Aged Out   DTaP/Tdap/Td vaccine  Discontinued   Flu Shot  Discontinued  *Topic was postponed. The date shown is not the original due date.    Advanced directives: (Copy Requested) Please bring a copy of your health care power of attorney and living will to the office to be added to your chart at your convenience.  Next Medicare Annual Wellness Visit scheduled for next year: Yes  insert Preventive Care attachment Insert FALL PREVENTION attachment if needed

## 2023-11-26 NOTE — Progress Notes (Signed)
Subjective:   Amy Burgess is a 58 y.o. female who presents for Medicare Annual (Subsequent) preventive examination.  Visit Complete: Virtual I connected with  [name] on 11/26/23 by a video and audio enabled telemedicine application and verified that I am speaking with the correct person using two identifiers.  Patient Location: Home  Provider Location: Home Office  I discussed the limitations of evaluation and management by telemedicine. The patient expressed understanding and agreed to proceed.  Vital Signs: Because this visit was a virtual/telehealth visit, some criteria may be missing or patient reported. Any vitals not documented were not able to be obtained and vitals that have been documented are patient reported.  Patient Medicare AWV questionnaire was completed by the patient on 11/26/2023; I have confirmed that all information answered by patient is correct and no changes since this date.        Objective:    Today's Vitals   11/26/23 1621  PainSc: 3    There is no height or weight on file to calculate BMI.     11/26/2023    4:36 PM 10/23/2022    2:49 PM 01/02/2022    1:54 PM  Advanced Directives  Does Patient Have a Medical Advance Directive? No No No  Would patient like information on creating a medical advance directive?   No - Patient declined    Current Medications (verified) Outpatient Encounter Medications as of 11/26/2023  Medication Sig   acyclovir (ZOVIRAX) 400 MG tablet Take 1 tablet by mouth once daily   albuterol (VENTOLIN HFA) 108 (90 Base) MCG/ACT inhaler Inhale 2 puffs into the lungs every 6 (six) hours as needed for wheezing or shortness of breath.   amphetamine-dextroamphetamine (ADDERALL XR) 30 MG 24 hr capsule Take 1 capsule (30 mg total) by mouth daily.   clonazePAM (KLONOPIN) 0.5 MG tablet Take 1 tablet (0.5 mg total) by mouth 2 (two) times daily as needed for anxiety.   conjugated estrogens (PREMARIN) vaginal cream 2 grams  per applicator vaginally at HS   gabapentin (NEURONTIN) 800 MG tablet Take 1 tablet (800 mg total) by mouth 2 (two) times daily.   levothyroxine (SYNTHROID) 175 MCG tablet Take 1 tablet by mouth daily.   ondansetron (ZOFRAN) 4 MG tablet Take 4 mg by mouth every 8 (eight) hours as needed for nausea or vomiting.   sertraline (ZOLOFT) 100 MG tablet Take 1 tablet (100 mg total) by mouth daily.   tiZANidine (ZANAFLEX) 4 MG tablet tizanidine 4 mg tablet TAKE 1 TABLET BY MOUTH THREE TIMES A DAY AS NEEDED   levothyroxine (SYNTHROID) 150 MCG tablet TAKE 1 TABLET BY MOUTH EVERY DAY   No facility-administered encounter medications on file as of 11/26/2023.    Allergies (verified) Iodinated contrast media   History: Past Medical History:  Diagnosis Date   ADHD (attention deficit hyperactivity disorder)    Allergy    As a kid   Anemia    As a teenager and pre hysterectomy   Anxiety 06-20-98   After my husband's death   Arthritis    Asthma    Blood transfusion without reported diagnosis 6/92   Autologous after breast reduction   Depression    History of kidney stones    Hypothyroidism    Multiple sclerosis (HCC)    Neuromuscular disorder (HCC)    Ulcer 2005   Gastric ulcers   Past Surgical History:  Procedure Laterality Date   ABDOMINAL HYSTERECTOMY     APPENDECTOMY  CESAREAN SECTION     CHOLECYSTECTOMY     EXTRACORPOREAL SHOCK WAVE LITHOTRIPSY Right 01/02/2022   Procedure: EXTRACORPOREAL SHOCK WAVE LITHOTRIPSY (ESWL);  Surgeon: Heloise Purpura, MD;  Location: [facility];  Service: Urology;  Laterality: Right;   Family History  Problem Relation Age of Onset   Heart attack Mother    Early death Mother    Heart disease Mother    Hypertension Father    Diabetes type II Father    Alcohol abuse Father    Arthritis Father    Asthma Father    Diabetes Father    Hearing loss Father    Obesity Father    Multiple sclerosis Paternal Grandmother    Hearing loss Paternal  Grandmother    Heart disease Paternal Grandmother    Stroke Paternal Grandmother    Varicose Veins Paternal Grandmother    Heart disease Maternal Grandmother    Stroke Maternal Grandmother    Varicose Veins Maternal Grandmother    Diabetes Paternal Grandfather    Heart disease Paternal Grandfather    Stroke Paternal Grandfather    Alcohol abuse Paternal Uncle    Asthma Paternal Uncle    Varicose Veins Paternal Uncle    Alcohol abuse Paternal Uncle    Asthma Sister    Miscarriages / Stillbirths Sister    Diabetes Son    Early death Maternal Uncle    Early death Paternal Aunt    Kidney disease Paternal Uncle    Miscarriages / [place] Sister    Social History   Socioeconomic History   Marital status: Divorced    Spouse name: Not on file   Number of children: Not on file   Years of education: Not on file   Highest education level: Associate degree: academic program  Occupational History   Not on file  Tobacco Use   Smoking status: Former    Current packs/day: 0.00    Average packs/day: 0.5 packs/day for 3.0 years (1.5 ttl pk-yrs)    Types: Cigarettes    Quit date: 1985    Years since quitting: 40.1   Smokeless tobacco: Never   Tobacco comments:    It was during my dumb teenage years  Vaping Use   Vaping status: Never Used  Substance and Sexual Activity   Alcohol use: Not Currently    Comment: 3 drinks in last 6 months   Drug use: Never   Sexual activity: Yes    Birth control/protection: Post-menopausal  Other Topics Concern   Not on file  Social History Narrative   Right Handed   Social Drivers of Health   Financial Resource Strain: Low Risk  (11/26/2023)   Overall Financial Resource Strain (CARDIA)    Difficulty of Paying Living Expenses: Not hard at all  Food Insecurity: No Food Insecurity (11/26/2023)   Hunger Vital Sign    Worried About Running Out of Food in the Last Year: Never true    Ran Out of Food in the Last Year: Never true  Transportation  Needs: No Transportation Needs (11/26/2023)   PRAPARE - Administrator, Civil Service (Medical): No    Lack of Transportation (Non-Medical): No  Physical Activity: Inactive (11/26/2023)   Exercise Vital Sign    Days of Exercise per Week: 0 days    Minutes of Exercise per Session: 0 min  Stress: No Stress Concern Present (11/26/2023)   [employer] of Occupational Health - Occupational Stress Questionnaire    Feeling of Stress : Not  at all  Recent Concern: Stress - Stress Concern Present (09/28/2023)   [employer] of Occupational Health - Occupational Stress Questionnaire    Feeling of Stress : To some extent  Social Connections: Socially Isolated (11/26/2023)   Social Connection and Isolation Panel [NHANES]    Frequency of Communication with Friends and Family: Once a week    Frequency of Social Gatherings with Friends and Family: Once a week    Attends Religious Services: Never    Database administrator or Organizations: No    Attends Banker Meetings: Never    Marital Status: Living with partner    Tobacco Counseling Counseling given: Not Answered Tobacco comments: It was during my dumb teenage years   Clinical Intake:  Pre-visit preparation completed: Yes  Pain : 0-10 Pain Score: 3  Pain Type: Chronic pain Pain Location: Generalized Pain Descriptors / Indicators: Aching Pain Onset: More than a month ago Pain Frequency: Constant     Nutritional Risks: None Diabetes: No  How often do you need to have someone help you when you read instructions, pamphlets, or other written materials from your doctor or pharmacy?: 1 - Never  Interpreter Needed?: No  Information entered by :: NAllen LPN   Activities of Daily Living    11/26/2023   10:46 AM  In your present state of health, do you have any difficulty performing the following activities:  Hearing? 0  Vision? 1  Comment getting better  Difficulty concentrating or making decisions?  1  Walking or climbing stairs? 1  Dressing or bathing? 1  Comment takes time  Doing errands, shopping? 1  Preparing Food and eating ? Y  Using the Toilet? N  In the past six months, have you accidently leaked urine? Y  Do you have problems with loss of bowel control? Y  Managing your Medications? N  Managing your Finances? Y  Housekeeping or managing your Housekeeping? Y    Patient Care Team: Georganna Skeans, MD as PCP - General (Family Medicine)  Indicate any recent Medical Services you may have received from other than [facility] providers in the past year (date may be approximate).     Assessment:   This is a routine wellness examination for Amy Burgess.  Hearing/Vision screen Hearing Screening - Comments:: Denies hearing issues Vision Screening - Comments:: No regular eye exams   Goals Addressed             This Visit's Progress    Patient Stated       11/26/2023, to feel better       Depression Screen    11/26/2023    4:38 PM 10/01/2023    3:45 PM 06/18/2023    8:52 AM 10/23/2022    2:50 PM 05/20/2022    8:33 AM 12/09/2021    8:17 AM 09/09/2021    8:23 AM  PHQ 2/9 Scores  PHQ - 2 Score 0 0 2 0 2 2 2   PHQ- 9 Score   10  8 8 7     Fall Risk    11/26/2023   10:46 AM 10/01/2023    3:45 PM 10/23/2022    2:50 PM 10/20/2022    3:31 PM  Fall Risk   Falls in the past year? 1 1 1 1   Comment has MS     Number falls in past yr: 1 1 1 1   Injury with Fall? 1 0 0 0  Risk for fall due to : Impaired mobility;Impaired balance/gait History of fall(s)  History of fall(s);Impaired balance/gait;Impaired mobility;Medication side effect   Follow up Falls evaluation completed;Falls prevention discussed Falls evaluation completed Falls evaluation completed;Education provided;Falls prevention discussed     MEDICARE RISK AT HOME: Medicare Risk at Home Any stairs in or around the home?: (Patient-Rptd) Yes If so, are there any without handrails?: (Patient-Rptd) No Home free of loose throw rugs  in walkways, pet beds, electrical cords, etc?: (Patient-Rptd) Yes Adequate lighting in your home to reduce risk of falls?: (Patient-Rptd) Yes Life alert?: (Patient-Rptd) No Use of a cane, walker or w/c?: (Patient-Rptd) Yes Grab bars in the bathroom?: (Patient-Rptd) No Shower chair or bench in shower?: (Patient-Rptd) Yes Elevated toilet seat or a handicapped toilet?: (Patient-Rptd) Yes  TIMED UP AND GO:  Was the test performed?  No    Cognitive Function:        11/26/2023    4:38 PM 10/23/2022    2:52 PM  6CIT Screen  What Year? 0 points 0 points  What month? 0 points 0 points  What time? 0 points 0 points  Count back from 20 0 points 0 points  Months in reverse 0 points 0 points  Repeat phrase 0 points 0 points  Total Score 0 points 0 points    Immunizations Immunization History  Administered Date(s) Administered   Influenza,inj,Quad PF,6+ Mos 07/25/2021, 07/25/2022   Influenza,inj,quad, With Preservative 06/15/2019   Influenza-Unspecified 08/11/2021   Tetanus 12/07/2016   Unspecified SARS-COV-2 Vaccination 07/25/2022    TDAP status: Up to date  Flu Vaccine status: Up to date  Pneumococcal vaccine status: Up to date  Covid-19 vaccine status: Completed vaccines  Qualifies for Shingles Vaccine? Yes   Zostavax completed No   Shingrix Completed?: No.    Education has been provided regarding the importance of this vaccine. Patient has been advised to call insurance company to determine out of pocket expense if they have not yet received this vaccine. Advised may also receive vaccine at local pharmacy or Health Dept. Verbalized acceptance and understanding.  Screening Tests Health Maintenance  Topic Date Due   Hepatitis C Screening  Never done   Cervical Cancer Screening (HPV/Pap Cotest)  Never done   Zoster Vaccines- Shingrix (1 of 2) Never done   COVID-19 Vaccine (2 - 2024-25 season) 06/14/2023   Medicare Annual Wellness (AWV)  10/24/2023   Colonoscopy  10/12/2024  (Originally 11/06/2009)   MAMMOGRAM  06/15/2025   HIV Screening  Completed   HPV VACCINES  Aged Out   DTaP/Tdap/Td  Discontinued   INFLUENZA VACCINE  Discontinued    Health Maintenance  Health Maintenance Due  Topic Date Due   Hepatitis C Screening  Never done   Cervical Cancer Screening (HPV/Pap Cotest)  Never done   Zoster Vaccines- Shingrix (1 of 2) Never done   COVID-19 Vaccine (2 - 2024-25 season) 06/14/2023   Medicare Annual Wellness (AWV)  10/24/2023    Colorectal cancer screening: Type of screening: Colonoscopy. Completed 2015. Repeat every 10 years  Mammogram status: Completed 06/18/2023. Repeat every year  Bone Density status: n/a  Lung Cancer Screening: (Low Dose CT Chest recommended if Age 42-80 years, 20 pack-year currently smoking OR have quit w/in 15years.) does not qualify.   Lung Cancer Screening Referral: no  Additional Screening:  Hepatitis C Screening: does qualify;  Vision Screening: Recommended annual ophthalmology exams for early detection of glaucoma and other disorders of the eye. Is the patient up to date with their annual eye exam?  No  Who is the provider or what is  the name of the office in which the patient attends annual eye exams? none If pt is not established with a provider, would they like to be referred to a provider to establish care? No .   Dental Screening: Recommended annual dental exams for proper oral hygiene  Diabetic Foot Exam: n/a  Community Resource Referral / Chronic Care Management: CRR required this visit?  No   CCM required this visit?  No     Plan:     I have personally reviewed and noted the following in the patient's chart:   Medical and social history Use of alcohol, tobacco or illicit drugs  Current medications and supplements including opioid prescriptions. Patient is not currently taking opioid prescriptions. Functional ability and status Nutritional status Physical activity Advanced directives List of  other physicians Hospitalizations, surgeries, and ER visits in previous 12 months Vitals Screenings to include cognitive, depression, and falls Referrals and appointments  In addition, I have reviewed and discussed with patient certain preventive protocols, quality metrics, and best practice recommendations. A written personalized care plan for preventive services as well as general preventive health recommendations were provided to patient.     Barb Merino, LPN   12/18/6576   After Visit Summary: (MyChart) Due to this being a telephonic visit, the after visit summary with patients personalized plan was offered to patient via MyChart   Nurse Notes: none

## 2023-12-06 ENCOUNTER — Telehealth: Payer: Medicare Other | Admitting: Family

## 2023-12-06 DIAGNOSIS — U071 COVID-19: Secondary | ICD-10-CM | POA: Diagnosis not present

## 2023-12-06 MED ORDER — BENZONATATE 200 MG PO CAPS: 200.0000 mg | ORAL_CAPSULE | Freq: Two times a day (BID) | ORAL | 0 refills | Status: DC | PRN

## 2023-12-06 MED ORDER — NIRMATRELVIR/RITONAVIR (PAXLOVID)TABLET
3.0000 | ORAL_TABLET | Freq: Two times a day (BID) | ORAL | 0 refills | Status: AC
Start: 2023-12-06 — End: 2023-12-11

## 2023-12-06 MED ORDER — ALBUTEROL SULFATE HFA 108 (90 BASE) MCG/ACT IN AERS: 2.0000 | INHALATION_SPRAY | Freq: Four times a day (QID) | RESPIRATORY_TRACT | 0 refills | Status: DC | PRN

## 2023-12-06 NOTE — Progress Notes (Signed)
 Virtual Visit Consent   Amy Burgess, you are scheduled for a virtual visit with a [facility] provider today. Just as with appointments in the office, your consent must be obtained to participate. Your consent will be active for this visit and any virtual visit you may have with one of our providers in the next 365 days. If you have a MyChart account, a copy of this consent can be sent to you electronically.  As this is a virtual visit, video technology does not allow for your provider to perform a traditional examination. This may limit your provider's ability to fully assess your condition. If your provider identifies any concerns that need to be evaluated in person or the need to arrange testing (such as labs, EKG, etc.), we will make arrangements to do so. Although advances in technology are sophisticated, we cannot ensure that it will always work on either your end or our end. If the connection with a video visit is poor, the visit may have to be switched to a telephone visit. With either a video or telephone visit, we are not always able to ensure that we have a secure connection.  By engaging in this virtual visit, you consent to the provision of healthcare and authorize for your insurance to be billed (if applicable) for the services provided during this visit. Depending on your insurance coverage, you may receive a charge related to this service.  I need to obtain your verbal consent now. Are you willing to proceed with your visit today? Amy Burgess has provided verbal consent on 12/06/2023 for a virtual visit (video or telephone). Jannifer Rodney, FNP  Date: 12/06/2023 12:25 PM   Virtual Visit via Video Note   I, Jannifer Rodney, connected with  Amy Burgess  ([id number], 28-May-1966) on 12/06/23 at 12:15 PM EST by a video-enabled telemedicine application and verified that I am speaking with the correct person using two identifiers.  Location: Patient: Virtual Visit  Location Patient: Home Provider: Virtual Visit Location Provider: Home Office   I discussed the limitations of evaluation and management by telemedicine and the availability of in person appointments. The patient expressed understanding and agreed to proceed.    History of Present Illness: Amy Burgess is a 58 y.o. who identifies as a female who was assigned female at birth, and is being seen today for COVID. Her symptoms started two days ago. Took a test yesterday that was positive.   HPI: URI  This is a new problem. The current episode started in the past 7 days. The problem has been gradually worsening. The maximum temperature recorded prior to her arrival was 101 - 101.9 F. Associated symptoms include congestion, coughing (dry), headaches, rhinorrhea and a sore throat. Pertinent negatives include no ear pain or sneezing. Associated symptoms comments: Dizziness . She has tried acetaminophen for the symptoms. The treatment provided mild relief.    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/22/2022   Urinary urgency 04/22/2022   Depression 04/22/2022    Allergies:  Allergies  Allergen Reactions   Iodinated Contrast Media Other (See Comments)   Medications:  Current Outpatient Medications:    albuterol (VENTOLIN HFA) 108 (90 Base) MCG/ACT inhaler, Inhale 2 puffs into the lungs every 6 (six) hours as needed for wheezing or shortness of breath., Disp: 8 g, Rfl: 0   benzonatate (TESSALON)  200 MG capsule, Take 1 capsule (200 mg total) by mouth 2 (two) times daily as needed for cough., Disp: 20 capsule, Rfl: 0   nirmatrelvir/ritonavir (PAXLOVID) 20 x 150 MG & 10 x 100MG  TABS, Take 3 tablets by mouth 2 (two) times daily for 5 days. (Take nirmatrelvir 150 mg two tablets twice daily for 5 days and ritonavir 100 mg one tablet twice daily for 5  days) Patient GFR is 70, Disp: 30 tablet, Rfl: 0   acyclovir (ZOVIRAX) 400 MG tablet, Take 1 tablet by mouth once daily, Disp: 90 tablet, Rfl: 1   amphetamine-dextroamphetamine (ADDERALL XR) 30 MG 24 hr capsule, Take 1 capsule (30 mg total) by mouth daily., Disp: 30 capsule, Rfl: 0   clonazePAM (KLONOPIN) 0.5 MG tablet, Take 1 tablet (0.5 mg total) by mouth 2 (two) times daily as needed for anxiety., Disp: 12 tablet, Rfl: 0   conjugated estrogens (PREMARIN) vaginal cream, 2 grams per applicator vaginally at HS, Disp: 43 g, Rfl: 3   gabapentin (NEURONTIN) 800 MG tablet, Take 1 tablet (800 mg total) by mouth 2 (two) times daily., Disp: 180 tablet, Rfl: 3   levothyroxine (SYNTHROID) 150 MCG tablet, TAKE 1 TABLET BY MOUTH EVERY DAY, Disp: 90 tablet, Rfl: 0   levothyroxine (SYNTHROID) 175 MCG tablet, Take 1 tablet by mouth daily., Disp: , Rfl:    ondansetron (ZOFRAN) 4 MG tablet, Take 4 mg by mouth every 8 (eight) hours as needed for nausea or vomiting., Disp: , Rfl:    sertraline (ZOLOFT) 100 MG tablet, Take 1 tablet (100 mg total) by mouth daily., Disp: 90 tablet, Rfl: 3   tiZANidine (ZANAFLEX) 4 MG tablet, tizanidine 4 mg tablet TAKE 1 TABLET BY MOUTH THREE TIMES A DAY AS NEEDED, Disp: 270 tablet, Rfl: 3  Observations/Objective: Patient is well-developed, well-nourished in no acute distress.  Resting comfortably  at home.  Head is normocephalic, atraumatic.  No labored breathing.  Speech is clear and coherent with logical content.  Patient is alert and oriented at baseline.  Dry nonproductive cough  Assessment and Plan: 1. COVID-19 (Primary) - nirmatrelvir/ritonavir (PAXLOVID) 20 x 150 MG & 10 x 100MG  TABS; Take 3 tablets by mouth 2 (two) times daily for 5 days. (Take nirmatrelvir 150 mg two tablets twice daily for 5 days and ritonavir 100 mg one tablet twice daily for 5 days) Patient GFR is 70  Dispense: 30 tablet; Refill: 0 - albuterol (VENTOLIN HFA) 108 (90 Base) MCG/ACT inhaler; Inhale 2  puffs into the lungs every 6 (six) hours as needed for wheezing or shortness of breath.  Dispense: 8 g; Refill: 0 - benzonatate (TESSALON) 200 MG capsule; Take 1 capsule (200 mg total) by mouth 2 (two) times daily as needed for cough.  Dispense: 20 capsule; Refill: 0  COVID positive, rest, force fluids, tylenol as needed,report any worsening symptoms such as increased shortness of breath, swelling, or continued high fevers. Possible adverse effects discussed with antivirals.    Follow Up Instructions: I discussed the assessment and treatment plan with the patient. The patient was provided an opportunity to ask questions and all were answered. The patient agreed with the plan and demonstrated an understanding of the instructions.  A copy of instructions were sent to the patient via MyChart unless otherwise noted below.     The patient was advised to call back or seek an in-person evaluation if the symptoms worsen or if the condition fails to improve as anticipated.    Jannifer Rodney, FNP

## 2023-12-10 ENCOUNTER — Telehealth: Payer: Self-pay | Admitting: Family Medicine

## 2023-12-10 NOTE — Telephone Encounter (Signed)
 Called patient to schedule video visit with provider for medication requested for covid

## 2023-12-16 ENCOUNTER — Encounter: Payer: Self-pay | Admitting: Adult Health

## 2023-12-16 ENCOUNTER — Other Ambulatory Visit: Payer: Self-pay | Admitting: Adult Health

## 2023-12-16 DIAGNOSIS — F988 Other specified behavioral and emotional disorders with onset usually occurring in childhood and adolescence: Secondary | ICD-10-CM

## 2023-12-16 MED ORDER — AMPHETAMINE-DEXTROAMPHET ER 30 MG PO CP24: 30.0000 mg | ORAL_CAPSULE | Freq: Every day | ORAL | 0 refills | Status: DC

## 2023-12-16 NOTE — Telephone Encounter (Signed)
 [facility] confirmed pt picked up partial fill 10 tab on 11/27/23. Will send in a new 30day supply. Rx pended for approval.

## 2024-01-19 ENCOUNTER — Other Ambulatory Visit: Payer: Self-pay | Admitting: Neurology

## 2024-01-19 DIAGNOSIS — F988 Other specified behavioral and emotional disorders with onset usually occurring in childhood and adolescence: Secondary | ICD-10-CM

## 2024-01-19 MED ORDER — AMPHETAMINE-DEXTROAMPHET ER 30 MG PO CP24: 30.0000 mg | ORAL_CAPSULE | Freq: Every day | ORAL | 0 refills | Status: DC

## 2024-01-19 NOTE — Telephone Encounter (Signed)
 Last seen on 10/20/23 Follow up scheduled on 04/18/24 Adderall XR 30 mg tablets last filled on 12/17/23 Rx pending to be signed

## 2024-01-20 ENCOUNTER — Other Ambulatory Visit: Payer: Self-pay | Admitting: Family Medicine

## 2024-01-20 ENCOUNTER — Encounter: Payer: Self-pay | Admitting: Family

## 2024-01-20 ENCOUNTER — Ambulatory Visit (INDEPENDENT_AMBULATORY_CARE_PROVIDER_SITE_OTHER): Admitting: Family

## 2024-01-20 ENCOUNTER — Telehealth: Payer: Self-pay | Admitting: Family

## 2024-01-20 VITALS — BP 121/77 | HR 79 | Ht 62.5 in

## 2024-01-20 DIAGNOSIS — F419 Anxiety disorder, unspecified: Secondary | ICD-10-CM | POA: Diagnosis not present

## 2024-01-20 DIAGNOSIS — F32A Depression, unspecified: Secondary | ICD-10-CM | POA: Diagnosis not present

## 2024-01-20 MED ORDER — CLONAZEPAM 0.5 MG PO TABS: 0.5000 mg | ORAL_TABLET | Freq: Two times a day (BID) | ORAL | 0 refills | Status: AC | PRN

## 2024-01-20 NOTE — Telephone Encounter (Signed)
 Copied from CRM [id number]. Topic: Clinical - Medication Refill >> Jan 20, 2024  9:42 AM Franchot Heidelberg wrote: Most Recent Primary Care Visit:  Provider: Barb Merino  Department: [facility]  Visit Type: MEDICARE AWV, SEQUENTIAL  Date: 11/26/2023  Medication: clonazePAM (KLONOPIN) 0.5 MG tablet  Has the patient contacted their pharmacy? Yes (Agent: If no, request that the patient contact the pharmacy for the refill. If patient does not wish to contact the pharmacy document the reason why and proceed with request.) (Agent: If yes, when and what did the pharmacy advise?)  Is this the correct pharmacy for this prescription? Yes If no, delete pharmacy and type the correct one.  This is the patient's preferred pharmacy:   [facility] [address] [place], [place] - [address] [place] [address] Phone: [phone] Fax: [phone]  Has the prescription been filled recently? Yes  Is the patient out of the medication? Yes  Has the patient been seen for an appointment in the last year OR does the patient have an upcoming appointment? Yes  Can we respond through MyChart? Yes  Agent: Please be advised that Rx refills may take up to 3 business days. We ask that you follow-up with your pharmacy.

## 2024-01-20 NOTE — Progress Notes (Signed)
 Patient ID: Amy Burgess, female    DOB: 25-Mar-1966  MRN: [id number]  CC: Anxiety Depression Follow-Up  Subjective: Amy Burgess is a 58 y.o. female who presents for anxiety depression follow-up.  Her concerns today include:  Anxiety depression persisting. States she recently she began having panic attacks. Reports her sister-in-law recently passed away. Doing well on Sertraline, no issues/concerns. States Georganna Skeans, MD prescribed Clonazepam in the past for her. She denies thoughts of self-harm, suicidal ideations, homicidal ideations.  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 8 g 0   amphetamine-dextroamphetamine (ADDERALL XR) 30 MG 24 hr capsule Take 1 capsule (30 mg total) by mouth daily. 30 capsule 0   benzonatate (TESSALON) 200 MG capsule Take 1 capsule (200 mg total) by mouth 2 (two) times daily as needed for cough. 20 capsule 0   conjugated estrogens (PREMARIN) vaginal cream 2 grams per applicator vaginally at HS 43 g 3   gabapentin (NEURONTIN) 800 MG tablet Take 1 tablet (800 mg total) by mouth 2 (two) times daily. 180 tablet 3   levothyroxine (SYNTHROID) 175 MCG tablet Take 1 tablet by mouth daily.     ondansetron (ZOFRAN) 4 MG tablet Take 4 mg by mouth every 8 (eight) hours as needed for nausea or vomiting.     sertraline (ZOLOFT) 100 MG tablet Take 1 tablet (100 mg total) by mouth daily. 90 tablet 3   tiZANidine (ZANAFLEX) 4 MG tablet tizanidine 4  mg tablet TAKE 1 TABLET BY MOUTH THREE TIMES A DAY AS NEEDED 270 tablet 3   levothyroxine (SYNTHROID) 150 MCG tablet TAKE 1 TABLET BY MOUTH EVERY DAY 90 tablet 0   No current facility-administered medications on file prior to visit.    Allergies  Allergen Reactions   Iodinated Contrast Media Other (See Comments)    Social History   Socioeconomic History   Marital status: Divorced    Spouse name: Not on file   Number of children: Not on file   Years of education: Not on file   Highest education level: Associate degree: academic program  Occupational History   Not on file  Tobacco Use   Smoking status: Former    Current packs/day: 0.00    Average packs/day: 0.5 packs/day for 3.0 years (1.5 ttl pk-yrs)    Types: Cigarettes    Quit date: 51    Years since quitting: 40.2   Smokeless tobacco: Never   Tobacco comments:    It was during my dumb teenage years  Vaping Use   Vaping status: Never Used  Substance and Sexual Activity   Alcohol use: Not Currently    Comment: 3 drinks in last 6 months   Drug use: Never   Sexual activity: Yes    Birth control/protection: Post-menopausal  Other Topics Concern   Not on file  Social History Narrative   Right Handed   Social Drivers of Health   Financial Resource Strain: Low Risk  (11/26/2023)  Overall Financial Resource Strain (CARDIA)    Difficulty of Paying Living Expenses: Not hard at all  Food Insecurity: No Food Insecurity (11/26/2023)   Hunger Vital Sign    Worried About Running Out of Food in the Last Year: Never true    Ran Out of Food in the Last Year: Never true  Transportation Needs: No Transportation Needs (11/26/2023)   PRAPARE - Administrator, Civil Service (Medical): No    Lack of Transportation (Non-Medical): No  Physical Activity: Inactive (11/26/2023)   Exercise Vital Sign    Days of Exercise per Week: 0 days    Minutes of Exercise per Session: 0 min  Stress: No Stress Concern Present (11/26/2023)    [employer] of Occupational Health - Occupational Stress Questionnaire    Feeling of Stress : Not at all  Recent Concern: Stress - Stress Concern Present (09/28/2023)   [employer] of Occupational Health - Occupational Stress Questionnaire    Feeling of Stress : To some extent  Social Connections: Socially Isolated (11/26/2023)   Social Connection and Isolation Panel [NHANES]    Frequency of Communication with Friends and Family: Once a week    Frequency of Social Gatherings with Friends and Family: Once a week    Attends Religious Services: Never    Database administrator or Organizations: No    Attends Engineer, structural: Never    Marital Status: Living with partner  Intimate Partner Violence: Not At Risk (11/26/2023)   Humiliation, Afraid, Rape, and Kick questionnaire    Fear of Current or Ex-Partner: No    Emotionally Abused: No    Physically Abused: No    Sexually Abused: No    Family History  Problem Relation Age of Onset   Heart attack Mother    Early death Mother    Heart disease Mother    Hypertension Father    Diabetes type II Father    Alcohol abuse Father    Arthritis Father    Asthma Father    Diabetes Father    Hearing loss Father    Obesity Father    Multiple sclerosis Paternal Grandmother    Hearing loss Paternal Grandmother    Heart disease Paternal Grandmother    Stroke Paternal Grandmother    Varicose Veins Paternal Grandmother    Heart disease Maternal Grandmother    Stroke Maternal Grandmother    Varicose Veins Maternal Grandmother    Diabetes Paternal Grandfather    Heart disease Paternal Grandfather    Stroke Paternal Grandfather    Alcohol abuse Paternal Uncle    Asthma Paternal Uncle    Varicose Veins Paternal Uncle    Alcohol abuse Paternal Uncle    Asthma Sister    Miscarriages / Stillbirths Sister    Diabetes Son    Early death Maternal Uncle    Early death Paternal Aunt    Kidney disease Paternal Uncle     Miscarriages / [place] Sister     Past Surgical History:  Procedure Laterality Date   ABDOMINAL HYSTERECTOMY     APPENDECTOMY     CESAREAN SECTION     CHOLECYSTECTOMY     EXTRACORPOREAL SHOCK WAVE LITHOTRIPSY Right 01/02/2022   Procedure: EXTRACORPOREAL SHOCK WAVE LITHOTRIPSY (ESWL);  Surgeon: Heloise Purpura, MD;  Location: [facility];  Service: Urology;  Laterality: Right;    ROS: Review of Systems Negative except as stated above  PHYSICAL EXAM: BP 121/77 (BP Location: Right Arm, Patient Position: Sitting,  Cuff Size: Normal)   Pulse 79   Ht 5' 2.5" (1.588 m)   SpO2 95%   BMI 38.88 kg/m   Physical Exam HENT:     Head: Normocephalic and atraumatic.     Nose: Nose normal.     Mouth/Throat:     Mouth: Mucous membranes are moist.     Pharynx: Oropharynx is clear.  Eyes:     Extraocular Movements: Extraocular movements intact.     Conjunctiva/sclera: Conjunctivae normal.     Pupils: Pupils are equal, round, and reactive to light.  Cardiovascular:     Rate and Rhythm: Normal rate and regular rhythm.     Pulses: Normal pulses.     Heart sounds: Normal heart sounds.  Pulmonary:     Effort: Pulmonary effort is normal.     Breath sounds: Normal breath sounds.  Musculoskeletal:        General: Normal range of motion.     Cervical back: Normal range of motion and neck supple.  Neurological:     General: No focal deficit present.     Mental Status: She is alert and oriented to person, place, and time.  Psychiatric:        Mood and Affect: Mood normal.        Behavior: Behavior normal.     ASSESSMENT AND PLAN: 1. Anxiety and depression (Primary) - Patient denies thoughts of self-harm, suicidal ideations, homicidal ideations. - Continue Sertraline as prescribed. Counseled on medication adherence/adverse effects. No refills needed as of present.  - I sent a message to Georganna Skeans, MD for patient's request of Clonazepam refill.  - Follow-up with primary  provider as scheduled.    Patient was given the opportunity to ask questions.  Patient verbalized understanding of the plan and was able to repeat key elements of the plan. Patient was given clear instructions to go to Emergency Department or return to medical center if symptoms don't improve, worsen, or new problems develop.The patient verbalized understanding.    Return for Follow-Up or next available with Georganna Skeans, MD.  Rema Fendt, NP

## 2024-01-26 ENCOUNTER — Telehealth: Admitting: Physician Assistant

## 2024-01-26 DIAGNOSIS — H1032 Unspecified acute conjunctivitis, left eye: Secondary | ICD-10-CM

## 2024-01-26 MED ORDER — POLYMYXIN B-TRIMETHOPRIM 10000-0.1 UNIT/ML-% OP SOLN: OPHTHALMIC | 0 refills | Status: DC

## 2024-01-26 NOTE — Patient Instructions (Signed)
 Amy Burgess, thank you for joining Amy Climes, PA-C for today's virtual visit.  While this provider is not your primary care provider (PCP), if your PCP is located in our provider database this encounter information will be shared with them immediately following your visit.   A Amy Burgess account gives you access to today's visit and all your visits, tests, and labs performed at [facility] " click here if you don't have a Amy Burgess account or go to [email or web address]  Consent: (Patient) Amy Burgess provided verbal consent for this virtual visit at the beginning of the encounter.  Current Medications:  Current Outpatient Medications:    acyclovir (ZOVIRAX) 400 MG tablet, Take 1 tablet by mouth once daily, Disp: 90 tablet, Rfl: 1   albuterol (VENTOLIN HFA) 108 (90 Base) MCG/ACT inhaler, Inhale 2 puffs into the lungs every 6 (six) hours as needed for wheezing or shortness of breath., Disp: 8 g, Rfl: 0   amphetamine-dextroamphetamine (ADDERALL XR) 30 MG 24 hr capsule, Take 1 capsule (30 mg total) by mouth daily., Disp: 30 capsule, Rfl: 0   benzonatate (TESSALON) 200 MG capsule, Take 1 capsule (200 mg total) by mouth 2 (two) times daily as needed for cough., Disp: 20 capsule, Rfl: 0   clonazePAM (KLONOPIN) 0.5 MG tablet, Take 1 tablet (0.5 mg total) by mouth 2 (two) times daily as needed for anxiety., Disp: 12 tablet, Rfl: 0   conjugated estrogens (PREMARIN) vaginal cream, 2 grams per applicator vaginally at HS, Disp: 43 g, Rfl: 3   gabapentin (NEURONTIN) 800 MG tablet, Take 1 tablet (800 mg total) by mouth 2 (two) times daily., Disp: 180 tablet, Rfl: 3   levothyroxine (SYNTHROID) 150 MCG tablet, TAKE 1 TABLET BY MOUTH EVERY DAY, Disp: 90 tablet, Rfl: 0   levothyroxine (SYNTHROID) 175 MCG tablet, Take 1 tablet by mouth daily., Disp: , Rfl:    ondansetron (ZOFRAN) 4 MG tablet, Take 4 mg by mouth every 8 (eight) hours as needed for nausea  or vomiting., Disp: , Rfl:    sertraline (ZOLOFT) 100 MG tablet, Take 1 tablet (100 mg total) by mouth daily., Disp: 90 tablet, Rfl: 3   tiZANidine (ZANAFLEX) 4 MG tablet, tizanidine 4 mg tablet TAKE 1 TABLET BY MOUTH THREE TIMES A DAY AS NEEDED, Disp: 270 tablet, Rfl: 3   Medications ordered in this encounter:  No orders of the defined types were placed in this encounter.    *If you need refills on other medications prior to your next appointment, please contact your pharmacy*  Follow-Up: Call back or seek an in-person evaluation if the symptoms worsen or if the condition fails to improve as anticipated.  [facility] [phone]  Other Instructions Bacterial Conjunctivitis, Adult Bacterial conjunctivitis is an infection of your conjunctiva. This is the clear membrane that covers the white part of your eye and the inner part of your eyelid. This infection can make your eye: Red or pink. Itchy or irritated. This condition spreads easily from person to person (is contagious) and from one eye to the other eye. What are the causes? This condition is caused by germs (bacteria). You may get the infection if you come into close contact with: A person who has the infection. Items that have germs on them (are contaminated), such as face towels, contact lens solution, or eye makeup. What increases the risk? You are more likely to get this condition if: You have contact with people who have the infection.  You wear contact lenses. You have a sinus infection. You have had a recent eye injury or surgery. You have a weak body defense system (immune system). You have dry eyes. What are the signs or symptoms?  Thick, yellowish discharge from the eye. Tearing or watery eyes. Itchy eyes. Burning feeling in your eyes. Eye redness. Swollen eyelids. Blurred vision. How is this treated?  Antibiotic eye drops or ointment. Antibiotic medicine taken by mouth. This is used for  infections that do not get better with drops or ointment or that last more than 10 days. Cool, wet cloths placed on the eyes. Artificial tears used 2-6 times a day. Follow these instructions at home: Medicines Take or apply your antibiotic medicine as told by your doctor. Do not stop using it even if you start to feel better. Take or apply over-the-counter and prescription medicines only as told by your doctor. Do not touch your eyelid with the eye-drop bottle or the ointment tube. Managing discomfort Wipe any fluid from your eye with a warm, wet washcloth or a cotton ball. Place a clean, cool, wet cloth on your eye. Do this for 10-20 minutes, 3-4 times a day. General instructions Do not wear contacts until the infection is gone. Wear glasses until your doctor says it is okay to wear contacts again. Do not wear eye makeup until the infection is gone. Throw away old eye makeup. Change or wash your pillowcase every day. Do not share towels or washcloths. Wash your hands often with soap and water for at least 20 seconds and especially before touching your face or eyes. Use paper towels to dry your hands. Do not touch or rub your eyes. Do not drive or use heavy machinery if your vision is blurred. Contact a doctor if: You have a fever. You do not get better after 10 days. Get help right away if: You have a fever and your symptoms get worse all of a sudden. You have very bad pain when you move your eye. Your face: Hurts. Is red. Is swollen. You have sudden loss of vision. Summary Bacterial conjunctivitis is an infection of your conjunctiva. This infection spreads easily from person to person. Wash your hands often with soap and water for at least 20 seconds and especially before touching your face or eyes. Use paper towels to dry your hands. Take or apply your antibiotic medicine as told by your doctor. Contact a doctor if you have a fever or you do not get better after 10 days. This  information is not intended to replace advice given to you by your health care provider. Make sure you discuss any questions you have with your health care provider. Document Revised: 01/09/2021 Document Reviewed: 01/09/2021 Elsevier Patient Education  2024 Elsevier Inc.   If you have been instructed to have an in-person evaluation today at a local Urgent Care facility, please use the link below. It will take you to a list of all of our available [facility], including address, phone number and hours of operation. Please do not delay care.  [facility]  If you or a family member do not have a primary care provider, use the link below to schedule a visit and establish care. When you choose a [facility] primary care physician or advanced practice provider, you gain a long-term partner in health. Find a Primary Care Provider  Learn more about 's in-office and virtual care options:  - Get Care Now

## 2024-01-26 NOTE — Progress Notes (Signed)
 Virtual Visit Consent   Amy Burgess, you are scheduled for a virtual visit with a [facility] provider today. Just as with appointments in the office, your consent must be obtained to participate. Your consent will be active for this visit and any virtual visit you may have with one of our providers in the next 365 days. If you have a MyChart account, a copy of this consent can be sent to you electronically.  As this is a virtual visit, video technology does not allow for your provider to perform a traditional examination. This may limit your provider's ability to fully assess your condition. If your provider identifies any concerns that need to be evaluated in person or the need to arrange testing (such as labs, EKG, etc.), we will make arrangements to do so. Although advances in technology are sophisticated, we cannot ensure that it will always work on either your end or our end. If the connection with a video visit is poor, the visit may have to be switched to a telephone visit. With either a video or telephone visit, we are not always able to ensure that we have a secure connection.  By engaging in this virtual visit, you consent to the provision of healthcare and authorize for your insurance to be billed (if applicable) for the services provided during this visit. Depending on your insurance coverage, you may receive a charge related to this service.  I need to obtain your verbal consent now. Are you willing to proceed with your visit today? Amy Burgess has provided verbal consent on 01/26/2024 for a virtual visit (video or telephone). Amy Burgess, [place]  Date: 01/26/2024 2:44 PM   Virtual Visit via Video Note   I, Amy Burgess, connected with  Amy Burgess  ([id number], [birth year]) on 01/26/24 at  2:45 PM EDT by a video-enabled telemedicine application and verified that I am speaking with the correct person using two identifiers.  Location: Patient: Virtual  Visit Location Patient: Home Provider: Virtual Visit Location Provider: Home Office   I discussed the limitations of evaluation and management by telemedicine and the availability of in person appointments. The patient expressed understanding and agreed to proceed.    History of Present Illness: Amy Burgess is a 58 y.o. who identifies as a female who was assigned female at birth, and is being seen today for possible pink eye of R eye. Notes that she and her husband are cleaning out her sister's house after recent death. Notes something must have gotten into her eye -- this AM with redness, irritation and drainage from the L eye. Denies vision changes otherwise. Denies fever, chills. Denies severe eye pain. Start of mild irritation of R eye without drainage. Has been taking an oral OTC antihistamine. Does not wear contact lenses.     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   Urinary urgency 04/22/2022   Depression 04/22/2022    Allergies:  Allergies  Allergen Reactions   Iodinated Contrast Media Other (See Comments)   Medications:  Current Outpatient Medications:    trimethoprim-polymyxin b (POLYTRIM) ophthalmic solution, Apply 1-2 drops into affected eye QID x 5 days., Disp: 10 mL, Rfl: 0   acyclovir (ZOVIRAX) 400 MG tablet, Take 1 tablet by mouth once daily, Disp: 90 tablet, Rfl: 1   amphetamine-dextroamphetamine (ADDERALL XR)  30 MG 24 hr capsule, Take 1 capsule (30 mg total) by mouth daily., Disp: 30 capsule, Rfl: 0   clonazePAM (KLONOPIN) 0.5 MG tablet, Take 1 tablet (0.5 mg total) by mouth 2 (two) times daily as needed for anxiety., Disp: 12 tablet, Rfl: 0   conjugated estrogens (PREMARIN) vaginal cream, 2 grams per applicator vaginally at HS, Disp: 43 g, Rfl: 3   gabapentin (NEURONTIN) 800 MG tablet, Take 1  tablet (800 mg total) by mouth 2 (two) times daily., Disp: 180 tablet, Rfl: 3   levothyroxine (SYNTHROID) 175 MCG tablet, Take 1 tablet by mouth daily., Disp: , Rfl:    ondansetron (ZOFRAN) 4 MG tablet, Take 4 mg by mouth every 8 (eight) hours as needed for nausea or vomiting., Disp: , Rfl:    sertraline (ZOLOFT) 100 MG tablet, Take 1 tablet (100 mg total) by mouth daily., Disp: 90 tablet, Rfl: 3   tiZANidine (ZANAFLEX) 4 MG tablet, tizanidine 4 mg tablet TAKE 1 TABLET BY MOUTH THREE TIMES A DAY AS NEEDED, Disp: 270 tablet, Rfl: 3  Observations/Objective: Patient is well-developed, well-nourished in no acute distress.  Resting comfortably  at home.  Head is normocephalic, atraumatic.  No labored breathing.  Speech is clear and coherent with logical content.  Patient is alert and oriented at baseline.  L conjunctival injection with drainage noted. Pupils are equal and round. EOMI. No lid swelling or lesion noted. R conjunctiva within normal limits at time of video.  Assessment and Plan: 1. Acute bacterial conjunctivitis of left eye (Primary) - trimethoprim-polymyxin b (POLYTRIM) ophthalmic solution; Apply 1-2 drops into affected eye QID x 5 days.  Dispense: 10 mL; Refill: 0  Supportive measures discussed. Start Polytrim drops. Will need in-person follow-up if not resolving or any new/worsening symptoms despite treatment.   Follow Up Instructions: I discussed the assessment and treatment plan with the patient. The patient was provided an opportunity to ask questions and all were answered. The patient agreed with the plan and demonstrated an understanding of the instructions.  A copy of instructions were sent to the patient via MyChart unless otherwise noted below.   The patient was advised to call back or seek an in-person evaluation if the symptoms worsen or if the condition fails to improve as anticipated.    Amy Maillard, PA-C

## 2024-01-29 IMAGING — DX DG ABDOMEN 1V
1 series · 1 of 1 positions shown · non-contrast
Comparison: CT scan December 27, 2021

CLINICAL DATA: Pre lithotripsy.  5 cm stone on the right.

EXAM:
ABDOMEN - 1 VIEW

[abdomen kub]
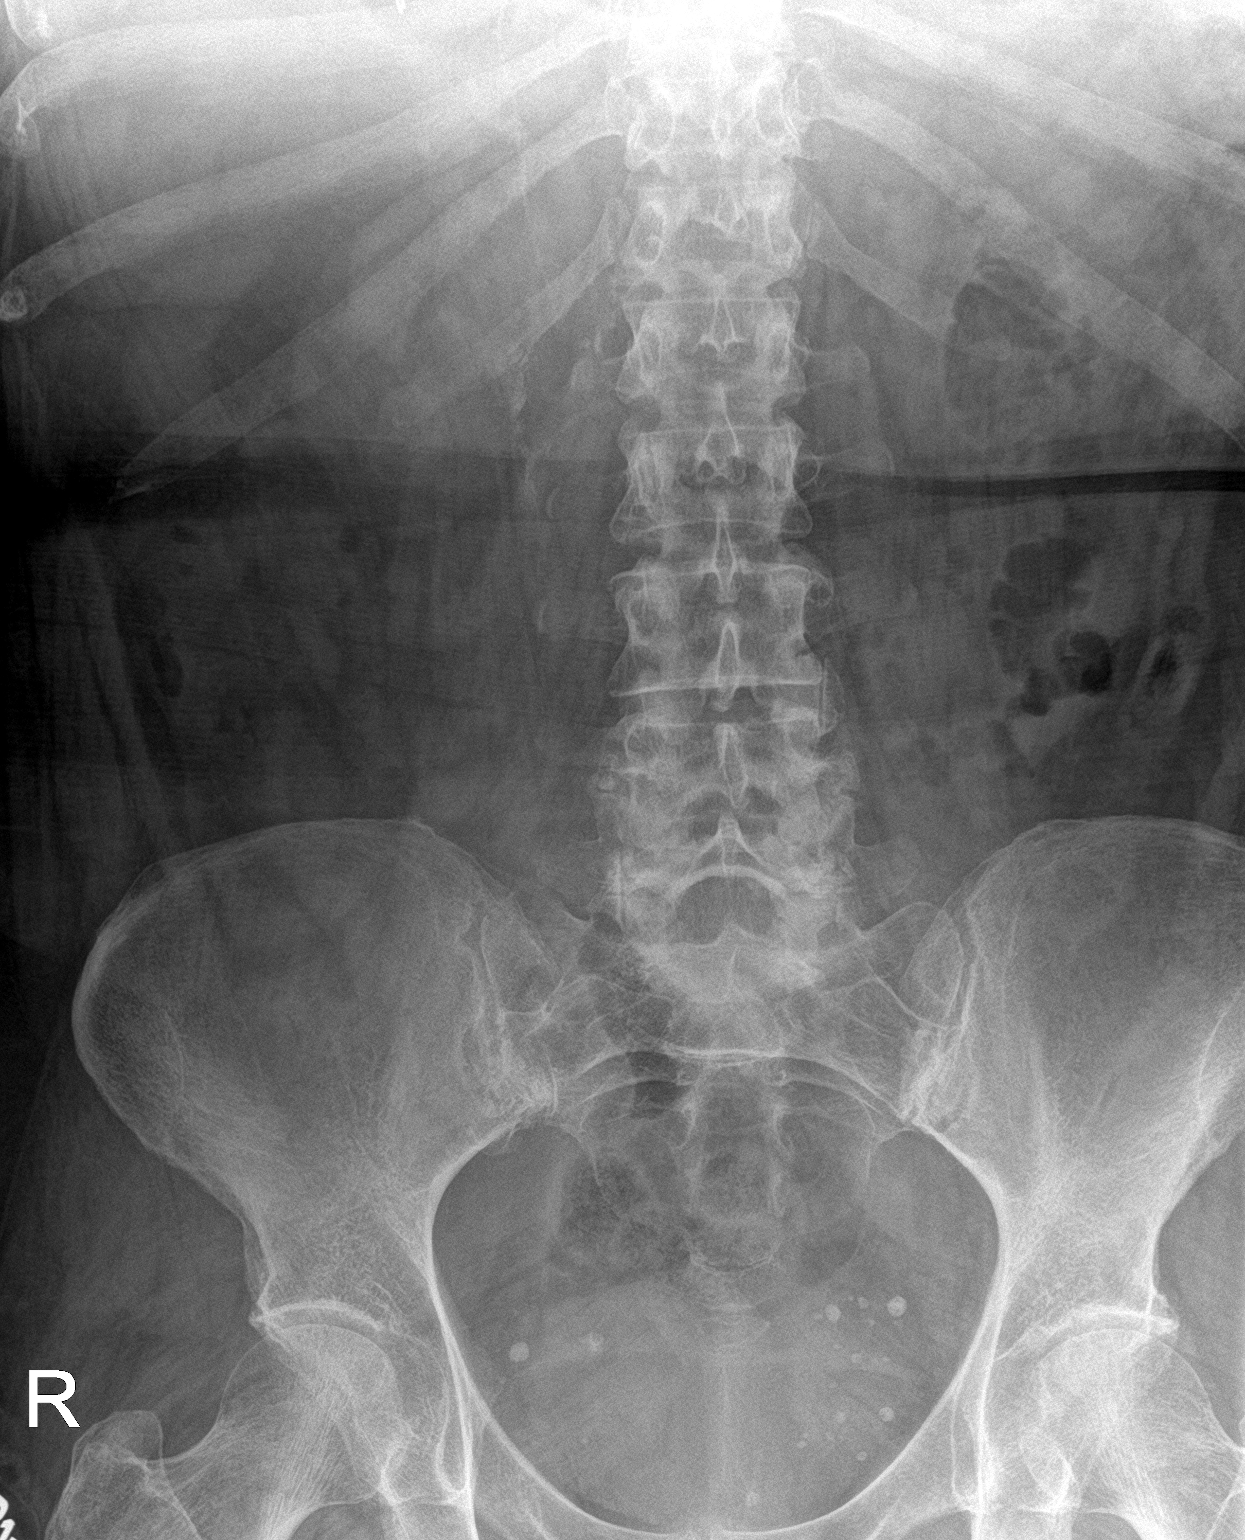

[1 of 1 positions shown; findings below may reference images not displayed]

FINDINGS: No convincing renal stones are identified. The known ureteral stone
is not definitively seen. A rounded density just inferior to the
medial aspect of the right L4 transverse process is more medially
located than expected for the ureteral stone. No other definite
stones are noted.
IMPRESSION: 1. No convincing renal or ureteral stones identified on today's
study. There is a rounded density just inferior to the medial aspect
of the right L4 transverse process. It is conceivable this
represents the patient's ureteral stone although it is more medially
located than expected. CT imaging would be more sensitive and
specific.

## 2024-02-10 ENCOUNTER — Ambulatory Visit (INDEPENDENT_AMBULATORY_CARE_PROVIDER_SITE_OTHER)

## 2024-02-10 ENCOUNTER — Ambulatory Visit (INDEPENDENT_AMBULATORY_CARE_PROVIDER_SITE_OTHER): Admitting: Family

## 2024-02-10 ENCOUNTER — Encounter: Payer: Self-pay | Admitting: Family

## 2024-02-10 VITALS — BP 132/82 | HR 98 | Temp 98.0°F | Resp 16 | Ht 62.5 in | Wt 203.0 lb

## 2024-02-10 DIAGNOSIS — J189 Pneumonia, unspecified organism: Secondary | ICD-10-CM

## 2024-02-10 NOTE — Progress Notes (Signed)
 Follow up with pneumonia

## 2024-02-10 NOTE — Progress Notes (Signed)
 Patient ID: Amy Burgess, female    DOB: 06-Mar-1966  MRN: [id number]  CC: Pneumonia Follow-Up  Subjective: Amy Burgess is a 58 y.o. female who presents for pneumonia follow-up.   Her concerns today include:  Patient states she was diagnosed with pneumonia on 01/29/2024 at an Urgent Care in [place]  while she was visiting. States she completed antibiotics. States she is feeling improved and denies red flag symptoms.  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 30 capsule 0   clonazePAM  (KLONOPIN ) 0.5 MG tablet Take 1 tablet (0.5 mg total) by mouth 2 (two) times daily as needed for anxiety. 12 tablet 0   conjugated estrogens  (PREMARIN ) vaginal cream 2 grams per applicator vaginally at HS 43 g 3   gabapentin  (NEURONTIN ) 800 MG tablet Take 1 tablet (800 mg total) by mouth 2 (two) times daily. 180 tablet 3   levothyroxine  (SYNTHROID ) 175 MCG tablet Take 1 tablet by mouth daily.     ondansetron (ZOFRAN) 4 MG tablet Take 4 mg by mouth every 8 (eight) hours as needed for nausea or vomiting.     sertraline  (ZOLOFT ) 100 MG tablet Take 1 tablet (100 mg total) by mouth daily. 90 tablet 3   tiZANidine  (ZANAFLEX ) 4 MG tablet tizanidine  4 mg tablet TAKE 1 TABLET BY MOUTH THREE TIMES A DAY AS NEEDED 270 tablet 3   trimethoprim -polymyxin b  (POLYTRIM ) ophthalmic solution Apply 1-2 drops into affected eye QID x 5 days. 10 mL 0   No current facility-administered medications on file prior to visit.    Allergies  Allergen  Reactions   Iodinated Contrast Media Other (See Comments)    Social History   Socioeconomic History   Marital status: Divorced    Spouse name: Not on file   Number of children: Not on file   Years of education: Not on file   Highest education level: Associate degree: academic program  Occupational History   Not on file  Tobacco Use   Smoking status: Former    Current packs/day: 0.00    Average packs/day: 0.5 packs/day for 3.0 years (1.5 ttl pk-yrs)    Types: Cigarettes    Quit date: 20    Years since quitting: 40.3   Smokeless tobacco: Never   Tobacco comments:    It was during my dumb teenage years  Vaping Use   Vaping status: Never Used  Substance and Sexual Activity   Alcohol use: Not Currently    Comment: 3 drinks in last 6 months   Drug use: Never   Sexual activity: Yes    Birth control/protection: Post-menopausal  Other Topics Concern   Not on file  Social History Narrative   Right Handed   Social Drivers of Health   Financial Resource Strain: Low Risk  (11/26/2023)   Overall Financial Resource Strain (CARDIA)    Difficulty of Paying Living Expenses: Not hard at all  Food Insecurity: No Food Insecurity (11/26/2023)   Hunger Vital Sign    Worried About Running Out of  Food in the Last Year: Never true    Ran Out of Food in the Last Year: Never true  Transportation Needs: No Transportation Needs (11/26/2023)   PRAPARE - Administrator, Civil Service (Medical): No    Lack of Transportation (Non-Medical): No  Physical Activity: Inactive (11/26/2023)   Exercise Vital Sign    Days of Exercise per Week: 0 days    Minutes of Exercise per Session: 0 min  Stress: No Stress Concern Present (11/26/2023)   [employer] of Occupational Health - Occupational Stress Questionnaire    Feeling of Stress : Not at all  Recent Concern: Stress - Stress Concern Present (09/28/2023)   [employer] of Occupational Health - Occupational Stress Questionnaire     Feeling of Stress : To some extent  Social Connections: Socially Isolated (11/26/2023)   Social Connection and Isolation Panel [NHANES]    Frequency of Communication with Friends and Family: Once a week    Frequency of Social Gatherings with Friends and Family: Once a week    Attends Religious Services: Never    Database administrator or Organizations: No    Attends Engineer, structural: Never    Marital Status: Living with partner  Intimate Partner Violence: Not At Risk (11/26/2023)   Humiliation, Afraid, Rape, and Kick questionnaire    Fear of Current or Ex-Partner: No    Emotionally Abused: No    Physically Abused: No    Sexually Abused: No    Family History  Problem Relation Age of Onset   Heart attack Mother    Early death Mother    Heart disease Mother    Hypertension Father    Diabetes type II Father    Alcohol abuse Father    Arthritis Father    Asthma Father    Diabetes Father    Hearing loss Father    Obesity Father    Multiple sclerosis Paternal Grandmother    Hearing loss Paternal Grandmother    Heart disease Paternal Grandmother    Stroke Paternal Grandmother    Varicose Veins Paternal Grandmother    Heart disease Maternal Grandmother    Stroke Maternal Grandmother    Varicose Veins Maternal Grandmother    Diabetes Paternal Grandfather    Heart disease Paternal Grandfather    Stroke Paternal Grandfather    Alcohol abuse Paternal Uncle    Asthma Paternal Uncle    Varicose Veins Paternal Uncle    Alcohol abuse Paternal Uncle    Asthma Sister    Miscarriages / Stillbirths Sister    Diabetes Son    Early death Maternal Uncle    Early death Paternal Aunt    Kidney disease Paternal Uncle    Miscarriages / [place] Sister     Past Surgical History:  Procedure Laterality Date   ABDOMINAL HYSTERECTOMY     APPENDECTOMY     CESAREAN SECTION     CHOLECYSTECTOMY     EXTRACORPOREAL SHOCK WAVE LITHOTRIPSY Right 01/02/2022   Procedure: EXTRACORPOREAL  SHOCK WAVE LITHOTRIPSY (ESWL);  Surgeon: Florencio Hunting, MD;  Location: [facility];  Service: Urology;  Laterality: Right;    ROS: Review of Systems Negative except as stated above  PHYSICAL EXAM: BP 132/82   Pulse 98   Temp 98 F (36.7 C) (Oral)   Resp 16   Ht 5' 2.5" (1.588 m)   Wt 203 lb (92.1 kg)   LMP  (LMP Unknown)   SpO2 94%   BMI 36.54  kg/m   Physical Exam HENT:     Head: Normocephalic and atraumatic.     Nose: Nose normal.     Mouth/Throat:     Mouth: Mucous membranes are moist.     Pharynx: Oropharynx is clear.  Eyes:     Extraocular Movements: Extraocular movements intact.     Conjunctiva/sclera: Conjunctivae normal.     Pupils: Pupils are equal, round, and reactive to light.  Cardiovascular:     Rate and Rhythm: Normal rate and regular rhythm.     Pulses: Normal pulses.     Heart sounds: Normal heart sounds.  Pulmonary:     Effort: Pulmonary effort is normal.     Breath sounds: Normal breath sounds.  Musculoskeletal:        General: Normal range of motion.     Cervical back: Normal range of motion and neck supple.  Neurological:     General: No focal deficit present.     Mental Status: She is alert and oriented to person, place, and time.  Psychiatric:        Mood and Affect: Mood normal.        Behavior: Behavior normal.     ASSESSMENT AND PLAN: 1. Pneumonia due to infectious organism, unspecified laterality, unspecified part of lung (Primary) - Patient states she was diagnosed with pneumonia on 01/29/2024 at an Urgent Care in [place]  while she was visiting. Patient states she completed antibiotics. Patient today in office with no cardiopulmonary/acute distress. - Chest xray for evaluation.  - Follow-up with primary provider as scheduled. - DG Chest 2 View; Future    Patient was given the opportunity to ask questions.  Patient verbalized understanding of the plan and was able to repeat key elements of the plan. Patient was given  clear instructions to go to Emergency Department or return to medical center if symptoms don't improve, worsen, or new problems develop.The patient verbalized understanding.   Orders Placed This Encounter  Procedures   DG Chest 2 View     Return for Follow-Up or next available with Abraham Abo, MD.  Senaida Dama, NP

## 2024-02-12 ENCOUNTER — Encounter: Payer: Self-pay | Admitting: Family

## 2024-02-15 ENCOUNTER — Encounter: Payer: Self-pay | Admitting: Family

## 2024-02-15 ENCOUNTER — Other Ambulatory Visit: Payer: Self-pay | Admitting: Neurology

## 2024-02-15 DIAGNOSIS — F988 Other specified behavioral and emotional disorders with onset usually occurring in childhood and adolescence: Secondary | ICD-10-CM

## 2024-02-15 MED ORDER — AMPHETAMINE-DEXTROAMPHET ER 30 MG PO CP24: 30.0000 mg | ORAL_CAPSULE | Freq: Every day | ORAL | 0 refills | Status: DC

## 2024-02-15 NOTE — Telephone Encounter (Signed)
 Last seen 10/20/23 and next f/u 04/18/24. Last refilled 01/20/24 #30.

## 2024-02-18 ENCOUNTER — Encounter: Payer: Self-pay | Admitting: *Deleted

## 2024-03-09 ENCOUNTER — Telehealth: Payer: Self-pay | Admitting: Adult Health

## 2024-03-09 NOTE — Telephone Encounter (Signed)
 Patient disability paperwork dropped off to [place] for [place].

## 2024-03-10 DIAGNOSIS — Z0289 Encounter for other administrative examinations: Secondary | ICD-10-CM

## 2024-03-17 NOTE — Telephone Encounter (Signed)
 Received completed form. Form was faxed and confirmation log was received. Spoke with patient to advise her of same, per her request a copy is being mailed out. Original and copy brought back to medical records.

## 2024-03-17 NOTE — Telephone Encounter (Signed)
 Forms completed and signed by Camilo Cella NP. Will turn into the medical records for the pt.

## 2024-03-29 ENCOUNTER — Other Ambulatory Visit: Payer: Self-pay | Admitting: Neurology

## 2024-03-29 ENCOUNTER — Encounter: Payer: Self-pay | Admitting: Adult Health

## 2024-03-29 DIAGNOSIS — F988 Other specified behavioral and emotional disorders with onset usually occurring in childhood and adolescence: Secondary | ICD-10-CM

## 2024-03-29 MED ORDER — AMPHETAMINE-DEXTROAMPHET ER 30 MG PO CP24: 30.0000 mg | ORAL_CAPSULE | Freq: Every day | ORAL | 0 refills | Status: DC

## 2024-04-18 ENCOUNTER — Ambulatory Visit: Payer: Medicare Other | Admitting: Adult Health

## 2024-04-21 NOTE — Progress Notes (Signed)
 [facility]  PATIENT: Amy Burgess DOB: Nov 17, 1965  REFERRING DOCTOR OR PCP: Raguel Blush, MD SOURCE: Patient, notes from primary care,  _________________________________   HISTORICAL  CHIEF COMPLAINT:  Chief Complaint  Patient presents with   Follow-up    RM 3, alone. Last seen 10/2023. MS DMT: off therapy. Feels MS is flaring up d/t being under extreme stress. Scheduled MRI Brain 05/14/24 at [facility] imaging. Last exam about a couple years. She knows to update eye exam. Provided her [employer]. Ambulates with cane.     HISTORY OF PRESENT ILLNESS:  Amy Burgess is a 58 y.o. woman who was diagnosed with multiple sclerosis in 2015.     Update 04/22/2024 JM: Returns for follow-up visit.  Previously seen 10/20/2023  Denies any new MS symptoms.  Reports some worsening of chronic symptoms due to significant increased family stressors (passing of SIL in March, left with a lot of financial burden). Has noticed increased dizziness, worsening balance and gradual worsening of vision as well as intermittent numbness in the middle of her forehead.  Reports increased anxiety and insomnia.  She was restarted on clonazepam  with PCP and uses sparingly.  Remains on sertraline  100 mg daily.  Has tried melatonin in the past but feels her body starts to become dependent on this.  Will use a cane when outdoors, typically does not use in home. Reports gradual decline of vision, she plans on scheduling visit with ophthalmologist as she is overdue (last seen over 4 years ago).  She also mentions being diagnosed with Hashimoto's back in March and is currently being followed by endocrinology.  Continued RLE weakness and spasms as well as bilateral foot numbness which she feels in unchanged. Continues on tizanidine  as needed but limits use especially if she needs to leave her house. Continues on gabapentin  800 mg nightly and will take 200-400mg  up to twice daily as needed but this also  causes fatigue so she limits use as well.   She tries to keep active as able.  She enjoys reading and crocheting.  She continues to cook but needs to prepare meals and stages due to fatigue.  She has some urinary incontinence and uses pads.   Ditropan dried her out so she prefers not to be on one.  She has been struggling more with bowel urgency.  Continued fatigue although Adderall  helps.  As mentioned above, having increased issues with insomnia and wakes up frequently at night.  She feels groggy in the morning upon awakening.  She notes cognitive issues with brain fog and poor focus but this is unchanged.   She is scheduled for MRI brain next month. Last imaging completed was in 2023 which was overall stable.        History of MS diagnosis: She was diagnosed with MS in 2005.   She had balance issues and slurred speech.  She had a positive FH of MS (her paternal grandmother).    She was placed on Avonex but developed antibodies.    She saw Dr. Dr. Caprice in [place], [place] . MRi was reportedly c/w MS.  She also had a lumbar puncture.   She was in the [place] trial (Dr. Caprice) and had treatments in 2009, 2010 and 2018.   She had frequent  MRI as part of the trial and reports she had progression.SABRA   She was on Gilenya briefly in 2017.   Due to new onset vertigo, she had an MRI and was told she had no  new lesions at that time.   MRI in 2023 showed low plaque burden in brain and no spinal cord lesions.  .      Her last brain MRI was performed 01/02/2021 in [place].  She was able to pull up the results on her phone.  We do not have the images.  It was read as showing tiny infarcts in cerebellum and non-specific white matter changes but no acute findings.   It was performed at [facility]  in [place], [place]     (Phone [phone] / Fax [phone]).  We will try to get the images.  She is a retired Film/video editor.     IMAGING: MRI of the brain 10/30/2021 showed several small  T2/FLAIR hyperintense foci in the hemispheres.  These were nonspecific but could be consistent with demyelination or chronic microvascular ischemic change..  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  MRI of the thoracic spine 11/02/2021 showed a normal spinal cord and no significant degenerative changes  REVIEW OF SYSTEMS: Constitutional: No fevers, chills, sweats, or change in appetite.  She has fatigue. Eyes: No visual changes, double vision, eye pain Ear, nose and throat: No hearing loss, ear pain, nasal congestion, sore throat Cardiovascular: No chest pain, palpitations Respiratory:  No shortness of breath at rest or with exertion.   No wheezes GastrointestinaI: No nausea, vomiting, diarrhea, abdominal pain, fecal incontinence Genitourinary:  No dysuria, urinary retention or frequency.  No nocturia. Musculoskeletal:  No neck pain, back pain Integumentary: No rash, pruritus, skin lesions Neurological: as above Psychiatric: No depression at this time.  No anxiety Endocrine: No palpitations, diaphoresis, change in appetite, change in weigh or increased thirst Hematologic/Lymphatic:  No anemia, purpura, petechiae. Allergic/Immunologic: No itchy/runny eyes, nasal congestion, recent allergic reactions, rashes  ALLERGIES: Allergies  Allergen Reactions   Iodinated Contrast Media Other (See Comments)    HOME MEDICATIONS:  Current Outpatient Medications:    acyclovir  (ZOVIRAX ) 400 MG tablet, Take 1 tablet by mouth once daily, Disp: 90 tablet, Rfl: 1   amphetamine -dextroamphetamine (ADDERALL  XR) 30 MG 24 hr capsule, Take 1 capsule (30 mg total) by mouth daily., Disp: 30 capsule, Rfl: 0   clonazePAM  (KLONOPIN ) 0.5 MG tablet, Take 1 tablet (0.5 mg total) by mouth 2 (two) times daily as needed for anxiety.,  Disp: 12 tablet, Rfl: 0   conjugated estrogens  (PREMARIN ) vaginal cream, 2 grams per applicator vaginally at HS, Disp: 43 g, Rfl: 3   gabapentin  (NEURONTIN ) 800 MG tablet, Take 1 tablet (800 mg total) by mouth 2 (two) times daily., Disp: 180 tablet, Rfl: 3   levothyroxine  (SYNTHROID ) 175 MCG tablet, Take 1 tablet by mouth daily., Disp: , Rfl:    ondansetron (ZOFRAN) 4 MG tablet, Take 4 mg by mouth every 8 (eight) hours as needed for nausea or vomiting., Disp: , Rfl:    sertraline  (ZOLOFT ) 100 MG tablet, Take 1 tablet (100 mg total) by mouth daily., Disp: 90 tablet, Rfl: 3   tiZANidine  (ZANAFLEX ) 4 MG tablet, tizanidine  4 mg tablet TAKE 1 TABLET BY MOUTH THREE TIMES A DAY AS NEEDED, Disp: 270 tablet, Rfl: 3   traZODone  (DESYREL ) 50 MG tablet, Take 0.5-1 tablets (25-50 mg total) by mouth at bedtime as needed for sleep., Disp: 30 tablet, Rfl: 5   trimethoprim -polymyxin b  (POLYTRIM ) ophthalmic solution, Apply 1-2 drops into affected  eye QID x 5 days., Disp: 10 mL, Rfl: 0  PAST MEDICAL HISTORY: Past Medical History:  Diagnosis Date   ADHD (attention deficit hyperactivity disorder)    Allergy    As a kid   Anemia    As a teenager and pre hysterectomy   Anxiety 30-Jun-1998   After my husband's death   Arthritis    Asthma    Blood transfusion without reported diagnosis 6/92   Autologous after breast reduction   Depression    History of kidney stones    Hypothyroidism    Multiple sclerosis (HCC)    Neuromuscular disorder (HCC)    Ulcer 2005   Gastric ulcers    PAST SURGICAL HISTORY: Past Surgical History:  Procedure Laterality Date   ABDOMINAL HYSTERECTOMY     APPENDECTOMY     CESAREAN SECTION     CHOLECYSTECTOMY     EXTRACORPOREAL SHOCK WAVE LITHOTRIPSY Right 01/02/2022   Procedure: EXTRACORPOREAL SHOCK WAVE LITHOTRIPSY (ESWL);  Surgeon: Renda Glance, MD;  Location: [facility];  Service: Urology;  Laterality: Right;    FAMILY HISTORY: Family History  Problem Relation  Age of Onset   Heart attack Mother    Early death Mother    Heart disease Mother    Hypertension Father    Diabetes type II Father    Alcohol abuse Father    Arthritis Father    Asthma Father    Diabetes Father    Hearing loss Father    Obesity Father    Multiple sclerosis Paternal Grandmother    Hearing loss Paternal Grandmother    Heart disease Paternal Grandmother    Stroke Paternal Grandmother    Varicose Veins Paternal Grandmother    Heart disease Maternal Grandmother    Stroke Maternal Grandmother    Varicose Veins Maternal Grandmother    Diabetes Paternal Grandfather    Heart disease Paternal Grandfather    Stroke Paternal Grandfather    Alcohol abuse Paternal Uncle    Asthma Paternal Uncle    Varicose Veins Paternal Uncle    Alcohol abuse Paternal Uncle    Asthma Sister    Miscarriages / Stillbirths Sister    Diabetes Son    Early death Maternal Uncle    Early death Paternal Aunt    Kidney disease Paternal Uncle    Miscarriages / [place] Sister     SOCIAL HISTORY:  Social History   Socioeconomic History   Marital status: Divorced    Spouse name: Not on file   Number of children: Not on file   Years of education: Not on file   Highest education level: Associate degree: academic program  Occupational History   Not on file  Tobacco Use   Smoking status: Former    Current packs/day: 0.00    Average packs/day: 0.5 packs/day for 3.0 years (1.5 ttl pk-yrs)    Types: Cigarettes    Quit date: 1985    Years since quitting: 40.5   Smokeless tobacco: Never   Tobacco comments:    It was during my dumb teenage years  Vaping Use   Vaping status: Never Used  Substance and Sexual Activity   Alcohol use: Not Currently    Comment: 3 drinks in last 6 months   Drug use: Never   Sexual activity: Yes    Birth control/protection: Post-menopausal  Other Topics Concern   Not on file  Social History Narrative   Right Handed   Social Drivers of Health    Financial  Resource Strain: Low Risk  (11/26/2023)   Overall Financial Resource Strain (CARDIA)    Difficulty of Paying Living Expenses: Not hard at all  Food Insecurity: No Food Insecurity (11/26/2023)   Hunger Vital Sign    Worried About Running Out of Food in the Last Year: Never true    Ran Out of Food in the Last Year: Never true  Transportation Needs: No Transportation Needs (11/26/2023)   PRAPARE - Administrator, Civil Service (Medical): No    Lack of Transportation (Non-Medical): No  Physical Activity: Inactive (11/26/2023)   Exercise Vital Sign    Days of Exercise per Week: 0 days    Minutes of Exercise per Session: 0 min  Stress: No Stress Concern Present (11/26/2023)   [employer] of Occupational Health - Occupational Stress Questionnaire    Feeling of Stress : Not at all  Recent Concern: Stress - Stress Concern Present (09/28/2023)   [employer] of Occupational Health - Occupational Stress Questionnaire    Feeling of Stress : To some extent  Social Connections: Socially Isolated (11/26/2023)   Social Connection and Isolation Panel    Frequency of Communication with Friends and Family: Once a week    Frequency of Social Gatherings with Friends and Family: Once a week    Attends Religious Services: Never    Database administrator or Organizations: No    Attends Banker Meetings: Never    Marital Status: Living with partner  Intimate Partner Violence: Not At Risk (11/26/2023)   Humiliation, Afraid, Rape, and Kick questionnaire    Fear of Current or Ex-Partner: No    Emotionally Abused: No    Physically Abused: No    Sexually Abused: No     PHYSICAL EXAM  Vitals:   04/22/24 0812  BP: 120/79  Pulse: 74  SpO2: 97%  Weight: 204 lb 12.8 oz (92.9 kg)  Height: 5' 2 (1.575 m)   Body mass index is 37.46 kg/m.   General: The patient is well-developed and well-nourished and in no acute distress  HEENT:  Head is [place]/AT.  Sclera are  anicteric.     Skin: Extremities are without rash or  edema.   Neurologic Exam  Mental status: The patient is alert and oriented x 3 at the time of the examination. The patient has apparent normal recent and remote memory, with an apparently normal attention span and concentration ability.   Speech is normal.  Cranial nerves: Extraocular movements are full.  Facial strength and sensation was normal..  Trapezius and sternocleidomastoid strength is normal. No dysarthria is noted.   No obvious hearing deficits are noted.  Motor:  Muscle bulk is normal.  Tone difficult to test in right leg due to pain. Strength is  5 / 5 in left upper and lower extremity, right upper and lower extremity 4+/5 weakness although testing difficult due to pain with spasms and noted giveaway weakness  Sensory: Sensory testing is intact to pinprick, soft touch and vibration sensation in amrs.   She reports reduced right leg vibration sensation.    Coordination: Cerebellar testing reveals good finger-nose-finger.     Gait and station: Station is normal.   The gait has a slight limp but no definite foot drop  The tandem gait is wide.  Ambulating with a cane.    Reflexes: Deep tendon reflexes are symmetric and normal in arms and increased right ankle.          ASSESSMENT AND PLAN  MS (multiple sclerosis) (HCC)  Depression, unspecified depression type  Gait disturbance  Psychophysiological insomnia  Cognitive deficit secondary to multiple sclerosis (HCC)   Noted worsening of chronic deficits in setting of increased stress (dizziness, imbalance, vision). Noted mild RUE weakness not previously seen although did note some giveaway weakness. She is scheduled for repeat MRI brain 05/14/2024.  MRI 10/2021 showed no spinal plaques and a fairly low number of brain lesions.  Therefore, she has remained off DMT.  Advised to schedule follow-up ophthalmology visit Continue Adderall  XR 30mg  daily - refill up to date Continue  sertraline  100 mg daily.  Declines interest in increasing dose currently. Start trazodone  25-50mg  nightly PRN Continue gabapentin  and tizanidine , refills up to date Assisting with disability, impairments include right leg spasticity, reduced gait and fatigue Rtc 6 months with Dr. Vear or sooner I new or worsening neurologic issues       I personally spent a total of 50 minutes in the care of the patient today including preparing to see the patient, performing a medically appropriate exam/evaluation, counseling and educating, placing orders, and documenting clinical information in the EHR.   Harlene Bogaert, AGNP-BC  [facility] [address] [place], [place] [address]  Phone [phone] Fax [phone] Note: This document was prepared with digital dictation and possible smart phrase technology. Any transcriptional errors that result from this process are unintentional.

## 2024-04-22 ENCOUNTER — Ambulatory Visit (INDEPENDENT_AMBULATORY_CARE_PROVIDER_SITE_OTHER): Admitting: Adult Health

## 2024-04-22 ENCOUNTER — Encounter: Payer: Self-pay | Admitting: Adult Health

## 2024-04-22 VITALS — BP 120/79 | HR 74 | Ht 62.0 in | Wt 204.8 lb

## 2024-04-22 DIAGNOSIS — F5104 Psychophysiologic insomnia: Secondary | ICD-10-CM | POA: Diagnosis not present

## 2024-04-22 DIAGNOSIS — G35 Multiple sclerosis: Secondary | ICD-10-CM

## 2024-04-22 DIAGNOSIS — F32A Depression, unspecified: Secondary | ICD-10-CM | POA: Diagnosis not present

## 2024-04-22 DIAGNOSIS — R269 Unspecified abnormalities of gait and mobility: Secondary | ICD-10-CM

## 2024-04-22 DIAGNOSIS — F09 Unspecified mental disorder due to known physiological condition: Secondary | ICD-10-CM

## 2024-04-22 MED ORDER — TRAZODONE HCL 50 MG PO TABS: 25.0000 mg | ORAL_TABLET | Freq: Every evening | ORAL | 5 refills | Status: AC | PRN

## 2024-04-22 NOTE — Patient Instructions (Addendum)
 Your Plan:  Proceed with MRI brain as scheduled next month  Please schedule follow up visit with ophthalmology for routine eye exam   Continue gabapentin   Continue tizanidine   Continue Adderall  XR 30 mg daily  Continue Zoloft  100mg  daily - if you feel this needs to be increased, please let me know  Start trazodone  25-50mg  at night as needed to help with sleep      Follow up in 6 months or call earlier if needed     Thank you for coming to see us  at [facility]. I hope we have been able to provide you high quality care today.  You may receive a patient satisfaction survey over the next few weeks. We would appreciate your feedback and comments so that we may continue to improve ourselves and the health of our patients.

## 2024-05-09 ENCOUNTER — Other Ambulatory Visit: Payer: Self-pay | Admitting: Adult Health

## 2024-05-09 DIAGNOSIS — F988 Other specified behavioral and emotional disorders with onset usually occurring in childhood and adolescence: Secondary | ICD-10-CM

## 2024-05-09 NOTE — Telephone Encounter (Signed)
 PT  called to request refill medication .    amphetamine -dextroamphetamine (ADDERALL  XR) 30 MG 24 hr capsule  Pt would like  medication sent to    [id number] - [place],  - [address] (Ph: [phone])

## 2024-05-10 ENCOUNTER — Other Ambulatory Visit: Payer: Self-pay | Admitting: Adult Health

## 2024-05-10 DIAGNOSIS — G35 Multiple sclerosis: Secondary | ICD-10-CM

## 2024-05-10 DIAGNOSIS — Z8619 Personal history of other infectious and parasitic diseases: Secondary | ICD-10-CM

## 2024-05-11 ENCOUNTER — Encounter: Payer: Self-pay | Admitting: Adult Health

## 2024-05-11 MED ORDER — AMPHETAMINE-DEXTROAMPHET ER 30 MG PO CP24: 30.0000 mg | ORAL_CAPSULE | Freq: Every day | ORAL | 0 refills | Status: DC

## 2024-05-14 ENCOUNTER — Ambulatory Visit
Admission: RE | Admit: 2024-05-14 | Discharge: 2024-05-14 | Disposition: A | Discharge status: Home / Self Care | Source: Ambulatory Visit | Attending: Adult Health | Admitting: Adult Health

## 2024-05-14 DIAGNOSIS — G35 Multiple sclerosis: Secondary | ICD-10-CM

## 2024-05-14 MED ORDER — GADOPICLENOL 0.5 MMOL/ML IV SOLN
9.0000 mL | Freq: Once | INTRAVENOUS | Status: AC | PRN
  Administered 2024-05-14: 9 mL via INTRAVENOUS

## 2024-05-16 ENCOUNTER — Ambulatory Visit: Payer: Self-pay | Admitting: Adult Health

## 2024-06-08 ENCOUNTER — Encounter: Payer: Self-pay | Admitting: Adult Health

## 2024-06-08 DIAGNOSIS — F988 Other specified behavioral and emotional disorders with onset usually occurring in childhood and adolescence: Secondary | ICD-10-CM

## 2024-06-08 MED ORDER — AMPHETAMINE-DEXTROAMPHET ER 30 MG PO CP24
30.0000 mg | ORAL_CAPSULE | Freq: Every day | ORAL | 0 refills | Status: DC
Start: 2024-06-08 — End: 2024-07-15

## 2024-06-08 NOTE — Telephone Encounter (Signed)
 Requested Prescriptions   Pending Prescriptions Disp Refills   amphetamine -dextroamphetamine (ADDERALL  XR) 30 MG 24 hr capsule 30 capsule 0    Sig: Take 1 capsule (30 mg total) by mouth daily.   Last seen 04/22/24 Next appt 11/30/24 Dispenses   Dispensed Days Supply Quantity Provider Pharmacy  AMPHETAM/DEXTROAMP ER 30MG  [facility] 05/11/2024 30 30 each Whitfield Raisin, NP [facility]  AMPHETAM/DEXTROAMP ER 30MG  [facility] 03/29/2024 30 30 each Whitfield Raisin, NP [address] [facility]  AMPHETAM/DEXTROAMP ER 30MG  [facility] 02/24/2024 30 30 each Sater, Charlie LABOR, MD [facility]  AMPHETAM/DEXTROAMP ER 30MG  [facility] 01/20/2024 30 30 each Sater, Charlie LABOR, MD [facility]  AMPHETAM/DEXTROAMP ER 30MG  [facility] 12/17/2023 30 30 each Sater, Charlie LABOR, MD [facility]  AMPHETAM/DEXTROAMP ER 30MG  [facility] 11/27/2023 10 10 each Whitfield Raisin, NP [facility]  AMPHETAM/DEXTROAMP ER 30MG  [facility] 10/20/2023 30 30 each Whitfield Raisin, NP [address] [facility]  AMPHETAM/DEXTROAMP ER 30MG  [facility] 09/15/2023 30 30 each Sater, Charlie LABOR, MD [facility]  AMPHETAM/DEXTROAMP ER 30MG  [facility] 08/10/2023 30 30 each Sater, Charlie LABOR, MD [facility]  AMPHETAM/DEXTROAMP ER 30MG  [facility] 07/07/2023 30 30 each Sater, Charlie LABOR, MD [facility][email or web address]

## 2024-06-13 ENCOUNTER — Ambulatory Visit
Admission: RE | Admit: 2024-06-13 | Discharge: 2024-06-13 | Disposition: A | Discharge status: Home / Self Care | Source: Ambulatory Visit | Attending: Family Medicine | Admitting: Family Medicine

## 2024-06-13 ENCOUNTER — Other Ambulatory Visit: Payer: Self-pay

## 2024-06-13 VITALS — BP 112/77 | HR 82 | Temp 97.6°F | Resp 16

## 2024-06-13 DIAGNOSIS — R42 Dizziness and giddiness: Secondary | ICD-10-CM

## 2024-06-13 NOTE — ED Notes (Signed)
 Patient is being discharged from the Urgent Care and sent to the Emergency Department via POV . Per provider, patient is in need of higher level of care due to lightheadedness. Patient is aware and verbalizes understanding of plan of care.  Vitals:   06/13/24 1412  BP: 112/77  Pulse: 82  Resp: 16  Temp: 97.6 F (36.4 C)  SpO2: 95%

## 2024-06-13 NOTE — ED Provider Notes (Signed)
 [facility]    CSN: [id number] Arrival date & time: 06/13/24  1359      History   Chief Complaint Chief Complaint  Patient presents with   Ear Fullness    HPI Jaylynne Patton Swisher is a 58 y.o. female.   Patient here today with spouse for evaluation of congestion, and feeling as if her ears are clogged.  She reports that she has had some left-sided facial tenderness.  She did try swimmer's ear drops yesterday without improvement.  She initially complained of dizziness but in describing symptoms states more of a lightheadedness as if she will pass out.  The history is provided by the patient.  Ear Fullness Pertinent negatives include no abdominal pain and no shortness of breath.    Past Medical History:  Diagnosis Date   ADHD (attention deficit hyperactivity disorder)    Allergy    As a kid   Anemia    As a teenager and pre hysterectomy   Anxiety 06-05-98   After my husband's death   Arthritis    Asthma    Blood transfusion without reported diagnosis 6/92   Autologous after breast reduction   Depression    History of kidney stones    Hypothyroidism    Multiple sclerosis (HCC)    Neuromuscular disorder (HCC)    Ulcer 2005   Gastric ulcers    Patient Active Problem List   Diagnosis Date Noted   Multiple sclerosis (HCC) 04/22/2022   Gait disturbance 04/22/2022   Attention deficit disorder (ADD) without hyperactivity 04/22/2022   Urinary urgency 04/22/2022   Depression 04/22/2022    Past Surgical History:  Procedure Laterality Date   ABDOMINAL HYSTERECTOMY     APPENDECTOMY     CESAREAN SECTION     CHOLECYSTECTOMY     EXTRACORPOREAL SHOCK WAVE LITHOTRIPSY Right 01/02/2022   Procedure: EXTRACORPOREAL SHOCK WAVE LITHOTRIPSY (ESWL);  Surgeon: Renda Glance, MD;  Location: [facility];  Service: Urology;  Laterality: Right;    OB History   No obstetric history on file.      Home Medications    Prior to Admission medications    Medication Sig Start Date End Date Taking? Authorizing Provider  acyclovir  (ZOVIRAX ) 400 MG tablet Take 1 tablet by mouth once daily 05/12/24  Yes McCue, Harlene, NP  amphetamine -dextroamphetamine (ADDERALL  XR) 30 MG 24 hr capsule Take 1 capsule (30 mg total) by mouth daily. 06/08/24  Yes McCue, Harlene, NP  clonazePAM  (KLONOPIN ) 0.5 MG tablet Take 1 tablet (0.5 mg total) by mouth 2 (two) times daily as needed for anxiety. 01/20/24  Yes Tanda Bleacher, MD  gabapentin  (NEURONTIN ) 800 MG tablet Take 1 tablet (800 mg total) by mouth 2 (two) times daily. 10/20/23  Yes McCue, Harlene, NP  levothyroxine  (SYNTHROID ) 175 MCG tablet Take 1 tablet by mouth daily. 11/06/23  Yes [provider]  ondansetron (ZOFRAN) 4 MG tablet Take 4 mg by mouth every 8 (eight) hours as needed for nausea or vomiting.   Yes [provider]  sertraline  (ZOLOFT ) 100 MG tablet Take 1 tablet (100 mg total) by mouth daily. 10/20/23  Yes McCue, Harlene, NP  tiZANidine  (ZANAFLEX ) 4 MG tablet tizanidine  4 mg tablet TAKE 1 TABLET BY MOUTH THREE TIMES A DAY AS NEEDED 10/20/23  Yes Whitfield Harlene, NP  conjugated estrogens  (PREMARIN ) vaginal cream 2 grams per applicator vaginally at HS Patient not taking: Reported on 06/13/2024 06/18/23   Tanda Bleacher, MD  traZODone  (DESYREL ) 50 MG tablet Take 0.5-1  tablets (25-50 mg total) by mouth at bedtime as needed for sleep. Patient not taking: Reported on 06/13/2024 04/22/24   Whitfield Raisin, NP  trimethoprim -polymyxin b  (POLYTRIM ) ophthalmic solution Apply 1-2 drops into affected eye QID x 5 days. Patient not taking: Reported on 06/13/2024 01/26/24   Gladis Elsie BROCKS, PA-C    Family History Family History  Problem Relation Age of Onset   Heart attack Mother    Early death Mother    Heart disease Mother    Hypertension Father    Diabetes type II Father    Alcohol abuse Father    Arthritis Father    Asthma Father    Diabetes Father    Hearing loss Father    Obesity Father    Multiple  sclerosis Paternal Grandmother    Hearing loss Paternal Grandmother    Heart disease Paternal Grandmother    Stroke Paternal Grandmother    Varicose Veins Paternal Grandmother    Heart disease Maternal Grandmother    Stroke Maternal Grandmother    Varicose Veins Maternal Grandmother    Diabetes Paternal Grandfather    Heart disease Paternal Grandfather    Stroke Paternal Grandfather    Alcohol abuse Paternal Uncle    Asthma Paternal Uncle    Varicose Veins Paternal Uncle    Alcohol abuse Paternal Uncle    Asthma Sister    Miscarriages / Stillbirths Sister    Diabetes Son    Early death Maternal Uncle    Early death Paternal Aunt    Kidney disease Paternal Uncle    Miscarriages / [place] Sister     Social History Social History   Tobacco Use   Smoking status: Former    Current packs/day: 0.00    Average packs/day: 0.5 packs/day for 3.0 years (1.5 ttl pk-yrs)    Types: Cigarettes    Quit date: 1985    Years since quitting: 40.6   Smokeless tobacco: Never   Tobacco comments:    It was during my dumb teenage years  Vaping Use   Vaping status: Never Used  Substance Use Topics   Alcohol use: Not Currently    Comment: 3 drinks in last 6 months   Drug use: Never     Allergies   Iodinated contrast media   Review of Systems Review of Systems  Constitutional:  Negative for chills and fever.  HENT:  Positive for ear pain.   Eyes:  Negative for discharge and redness.  Respiratory:  Negative for shortness of breath.   Gastrointestinal:  Negative for abdominal pain, nausea and vomiting.  Neurological:  Positive for light-headedness. Negative for dizziness.     Physical Exam Triage Vital Signs ED Triage Vitals  Encounter Vitals Group     BP 06/13/24 1412 112/77     Girls Systolic BP Percentile --      Girls Diastolic BP Percentile --      Boys Systolic BP Percentile --      Boys Diastolic BP Percentile --      Pulse Rate 06/13/24 1412 82     Resp 06/13/24  1412 16     Temp 06/13/24 1412 97.6 F (36.4 C)     Temp Source 06/13/24 1412 Oral     SpO2 06/13/24 1412 95 %     Weight --      Height --      Head Circumference --      Peak Flow --      Pain Score 06/13/24 1408 3  Pain Loc --      Pain Education --      Exclude from Growth Chart --    No data found.  Updated Vital Signs BP 112/77 (BP Location: Left Arm)   Pulse 82   Temp 97.6 F (36.4 C) (Oral)   Resp 16   LMP  (LMP Unknown)   SpO2 95%   Visual Acuity Right Eye Distance:   Left Eye Distance:   Bilateral Distance:    Right Eye Near:   Left Eye Near:    Bilateral Near:     Physical Exam Vitals and nursing note reviewed.  Constitutional:      General: She is not in acute distress.    Appearance: Normal appearance. She is not ill-appearing.  HENT:     Head: Normocephalic and atraumatic.     Right Ear: Tympanic membrane and ear canal normal.     Left Ear: Tympanic membrane and ear canal normal.  Eyes:     Conjunctiva/sclera: Conjunctivae normal.  Cardiovascular:     Rate and Rhythm: Normal rate.  Pulmonary:     Effort: Pulmonary effort is normal. No respiratory distress.  Neurological:     Mental Status: She is alert.  Psychiatric:        Mood and Affect: Mood normal.        Behavior: Behavior normal.        Thought Content: Thought content normal.      UC Treatments / Results  Labs (all labs ordered are listed, but only abnormal results are displayed) Labs Reviewed - No data to display  EKG   Radiology No results found.  Procedures Procedures (including critical care time)  Medications Ordered in UC Medications - No data to display  Initial Impression / Assessment and Plan / UC Course  I have reviewed the triage vital signs and the nursing notes.  Pertinent labs & imaging results that were available during my care of the patient were reviewed by me and considered in my medical decision making (see chart for details).    Given  concerns for lightheadedness recommended further evaluation in the emergency department for stat labs and imaging given history of MS.  Patient is agreeable to same.  Final Clinical Impressions(s) / UC Diagnoses   Final diagnoses:  Lightheadedness   Discharge Instructions   None    ED Prescriptions   None    PDMP not reviewed this encounter.   Billy Asberry FALCON, PA-C 06/13/24 1534

## 2024-06-13 NOTE — ED Triage Notes (Addendum)
 Cold symptoms, clogged ear making me a bit dizzy too - Entered by patient  Left ear feels clogged and left side of face is tender since Saturday. She used swimmer's ear drops yesterday. Taking zyrtec. Had negative home flu/covid test today. States she was tired Saturday and took a long nap. States she feels dizzy when changing position from lying down to standing. She also reports hx of MS. She took Mucinex DM yesterday but is out of Flonase .

## 2024-06-20 ENCOUNTER — Other Ambulatory Visit: Payer: Self-pay | Admitting: Family Medicine

## 2024-06-20 DIAGNOSIS — Z1231 Encounter for screening mammogram for malignant neoplasm of breast: Secondary | ICD-10-CM

## 2024-06-22 ENCOUNTER — Ambulatory Visit (INDEPENDENT_AMBULATORY_CARE_PROVIDER_SITE_OTHER): Admitting: Nurse Practitioner

## 2024-06-22 VITALS — BP 119/52 | HR 76 | Temp 97.8°F | Wt 202.4 lb

## 2024-06-22 DIAGNOSIS — J069 Acute upper respiratory infection, unspecified: Secondary | ICD-10-CM | POA: Diagnosis not present

## 2024-06-22 MED ORDER — AMOXICILLIN-POT CLAVULANATE 875-125 MG PO TABS: 1.0000 | ORAL_TABLET | Freq: Two times a day (BID) | ORAL | 0 refills | Status: DC

## 2024-06-22 NOTE — Progress Notes (Signed)
 Subjective   Patient ID: Amy Burgess, female    DOB: 1965/12/07, 58 y.o.   MRN: [id number]  Chief Complaint  Patient presents with   Ear Fullness   Allergic Rhinitis     Referring provider: Tanda Bleacher, MD  Amy Burgess is a 58 y.o. female with Past Medical History: No date: ADHD (attention deficit hyperactivity disorder) No date: Allergy     Comment:  As a kid No date: Anemia     Comment:  As a teenager and pre hysterectomy 05/1998: Anxiety     Comment:  After my husband's death No date: Arthritis No date: Asthma 6/92: Blood transfusion without reported diagnosis     Comment:  Autologous after breast reduction No date: Depression No date: History of kidney stones No date: Hypothyroidism No date: Multiple sclerosis (HCC) No date: Neuromuscular disorder (HCC) 2005: Ulcer     Comment:  Gastric ulcers  HPI  Patient presents today for an acute visit.  She has been having sinus congestion pressure and pain for the past week.  She is having left ear pain that radiates down into her throat.  She has been taking Zyrtec and Flonase .  We will order Augmentin .  Patient states that this has worked well for her in the past. Denies f/c/s, n/v/d, hemoptysis, PND, leg swelling Denies chest pain or edema     Allergies  Allergen Reactions   Iodinated Contrast Media Other (See Comments)    Immunization History  Administered Date(s) Administered   Influenza,inj,Quad PF,6+ Mos 07/25/2021, 07/25/2022   Influenza,inj,quad, With Preservative 06/15/2019   Influenza-Unspecified 08/11/2021, 08/09/2023   Moderna Sars-Covid-2 Vaccination 12/16/2019, 01/17/2020, 05/28/2020   Pfizer(Comirnaty)Fall Seasonal Vaccine 12 years and older 08/09/2023   Tetanus 12/07/2016   Unspecified SARS-COV-2 Vaccination 07/25/2022    Tobacco History: Social History   Tobacco Use  Smoking Status Former   Current packs/day: 0.00   Average packs/day: 0.5 packs/day for 3.0 years (1.5 ttl  pk-yrs)   Types: Cigarettes   Quit date: 1985   Years since quitting: 40.7  Smokeless Tobacco Never  Tobacco Comments   It was during my dumb teenage years   Counseling given: Not Answered Tobacco comments: It was during my dumb teenage years   Outpatient Encounter Medications as of 06/22/2024  Medication Sig   acyclovir  (ZOVIRAX ) 400 MG tablet Take 1 tablet by mouth once daily   amoxicillin -clavulanate (AUGMENTIN ) 875-125 MG tablet Take 1 tablet by mouth 2 (two) times daily.   amphetamine -dextroamphetamine (ADDERALL  XR) 30 MG 24 hr capsule Take 1 capsule (30 mg total) by mouth daily.   gabapentin  (NEURONTIN ) 800 MG tablet Take 1 tablet (800 mg total) by mouth 2 (two) times daily.   levothyroxine  (SYNTHROID ) 175 MCG tablet Take 1 tablet by mouth daily.   ondansetron (ZOFRAN) 4 MG tablet Take 4 mg by mouth every 8 (eight) hours as needed for nausea or vomiting.   sertraline  (ZOLOFT ) 100 MG tablet Take 1 tablet (100 mg total) by mouth daily.   tiZANidine  (ZANAFLEX ) 4 MG tablet tizanidine  4 mg tablet TAKE 1 TABLET BY MOUTH THREE TIMES A DAY AS NEEDED   clonazePAM  (KLONOPIN ) 0.5 MG tablet Take 1 tablet (0.5 mg total) by mouth 2 (two) times daily as needed for anxiety. (Patient not taking: Reported on 06/22/2024)   conjugated estrogens  (PREMARIN ) vaginal cream 2 grams per applicator vaginally at HS (Patient not taking: Reported on 06/13/2024)   traZODone  (DESYREL ) 50 MG tablet Take 0.5-1 tablets (25-50 mg total) by mouth at  bedtime as needed for sleep. (Patient not taking: Reported on 06/13/2024)   trimethoprim -polymyxin b  (POLYTRIM ) ophthalmic solution Apply 1-2 drops into affected eye QID x 5 days. (Patient not taking: Reported on 06/13/2024)   No facility-administered encounter medications on file as of 06/22/2024.    Review of Systems  Review of Systems  Constitutional: Negative.   HENT: Negative.    Cardiovascular: Negative.   Gastrointestinal: Negative.   Allergic/Immunologic: Negative.    Neurological: Negative.   Psychiatric/Behavioral: Negative.       Objective:   BP (!) 119/52   Pulse 76   Temp 97.8 F (36.6 C) (Oral)   Wt 202 lb 6.4 oz (91.8 kg)   LMP  (LMP Unknown)   SpO2 99%   BMI 37.02 kg/m   Wt Readings from Last 5 Encounters:  06/22/24 202 lb 6.4 oz (91.8 kg)  04/22/24 204 lb 12.8 oz (92.9 kg)  02/10/24 203 lb (92.1 kg)  10/20/23 216 lb (98 kg)  10/01/23 212 lb 12.8 oz (96.5 kg)     Physical Exam Vitals and nursing note reviewed.  Constitutional:      General: She is not in acute distress.    Appearance: She is well-developed.  Cardiovascular:     Rate and Rhythm: Normal rate and regular rhythm.  Pulmonary:     Effort: Pulmonary effort is normal.     Breath sounds: Normal breath sounds.  Neurological:     Mental Status: She is alert and oriented to person, place, and time.       Assessment & Plan:   Upper respiratory tract infection, unspecified type -     Amoxicillin -Pot Clavulanate; Take 1 tablet by mouth 2 (two) times daily.  Dispense: 20 tablet; Refill: 0     Return if symptoms worsen or fail to improve.   Bascom GORMAN Borer, NP 06/22/2024

## 2024-06-30 ENCOUNTER — Encounter

## 2024-06-30 DIAGNOSIS — Z1231 Encounter for screening mammogram for malignant neoplasm of breast: Secondary | ICD-10-CM

## 2024-07-13 ENCOUNTER — Encounter: Payer: Self-pay | Admitting: Adult Health

## 2024-07-13 DIAGNOSIS — F988 Other specified behavioral and emotional disorders with onset usually occurring in childhood and adolescence: Secondary | ICD-10-CM

## 2024-07-15 MED ORDER — AMPHETAMINE-DEXTROAMPHET ER 30 MG PO CP24: 30.0000 mg | ORAL_CAPSULE | Freq: Every day | ORAL | 0 refills | Status: DC

## 2024-07-15 NOTE — Telephone Encounter (Signed)
 Requested Prescriptions   Pending Prescriptions Disp Refills   amphetamine -dextroamphetamine (ADDERALL  XR) 30 MG 24 hr capsule 30 capsule 0    Sig: Take 1 capsule (30 mg total) by mouth daily.   Last seen 04/22/24 Next appt 11/30/24  Dispenses   Dispensed Days Supply Quantity Provider Pharmacy  AMPHETAM/DEXTROAMP ER 30MG  [facility] 06/10/2024 30 30 each Whitfield Raisin, NP [facility]  AMPHETAM/DEXTROAMP ER 30MG  CAP 05/11/2024 30 30 each Whitfield Raisin, NP [address] [facility]  AMPHETAM/DEXTROAMP ER 30MG  CAP 03/29/2024 30 30 each Whitfield Raisin, NP [facility]  AMPHETAM/DEXTROAMP ER 30MG  CAP 02/24/2024 30 30 each Sater, Charlie LABOR, MD [facility]  AMPHETAM/DEXTROAMP ER 30MG  CAP 01/20/2024 30 30 each Sater, Charlie LABOR, MD [facility]  AMPHETAM/DEXTROAMP ER 30MG  CAP 12/17/2023 30 30 each Sater, Charlie LABOR, MD [facility]  AMPHETAM/DEXTROAMP ER 30MG  CAP 11/27/2023 10 10 each Whitfield Raisin, NP [facility]  AMPHETAM/DEXTROAMP ER 30MG  CAP 10/20/2023 30 30 each Whitfield Raisin, NP [address] [facility]  AMPHETAM/DEXTROAMP ER 30MG  CAP 09/15/2023 30 30 each Sater, Charlie LABOR, MD [facility]  AMPHETAM/DEXTROAMP ER 30MG  CAP 08/10/2023 30 30 each Sater, Charlie LABOR, MD [facility][email or web address]

## 2024-08-16 MED ORDER — AMPHETAMINE-DEXTROAMPHET ER 30 MG PO CP24: 30.0000 mg | ORAL_CAPSULE | Freq: Every day | ORAL | 0 refills | Status: DC

## 2024-08-16 NOTE — Addendum Note (Signed)
 Addended by: NEYSA NENA RAMAN on: 08/16/2024 10:11 AM   Modules accepted: Orders

## 2024-08-16 NOTE — Telephone Encounter (Addendum)
 Last fill 07-15-2024 #30  per Drug Registry.

## 2024-08-21 ENCOUNTER — Encounter: Payer: Self-pay | Admitting: Family Medicine

## 2024-09-26 ENCOUNTER — Encounter: Payer: Self-pay | Admitting: Adult Health

## 2024-09-26 DIAGNOSIS — F988 Other specified behavioral and emotional disorders with onset usually occurring in childhood and adolescence: Secondary | ICD-10-CM

## 2024-09-27 ENCOUNTER — Other Ambulatory Visit: Payer: Self-pay | Admitting: Adult Health

## 2024-09-27 DIAGNOSIS — Z8619 Personal history of other infectious and parasitic diseases: Secondary | ICD-10-CM

## 2024-09-27 DIAGNOSIS — G35D Multiple sclerosis, unspecified: Secondary | ICD-10-CM

## 2024-09-27 MED ORDER — ACYCLOVIR 400 MG PO TABS: 400.0000 mg | ORAL_TABLET | Freq: Every day | ORAL | 3 refills | Status: AC

## 2024-09-27 MED ORDER — AMPHETAMINE-DEXTROAMPHET ER 30 MG PO CP24: 30.0000 mg | ORAL_CAPSULE | Freq: Every day | ORAL | 0 refills | Status: DC

## 2024-09-27 NOTE — Telephone Encounter (Signed)
 Okay to refill acyclovir . Will send in refill for Adderall . Thank you.

## 2024-09-27 NOTE — Telephone Encounter (Signed)
 Pt last seen 04/22/24, upcoming 11/30/24, Adderall  last filled 08/16/24  30day supply, refill is pended for approval.  Last OV note does not mention we are managing or to continue Acyclovir . We have refilled in the last yr, do you want to continue or PCP fill?

## 2024-09-30 ENCOUNTER — Ambulatory Visit: Payer: Self-pay | Admitting: Nurse Practitioner

## 2024-10-03 ENCOUNTER — Ambulatory Visit (INDEPENDENT_AMBULATORY_CARE_PROVIDER_SITE_OTHER)

## 2024-10-03 ENCOUNTER — Ambulatory Visit (HOSPITAL_COMMUNITY)
Admission: RE | Admit: 2024-10-03 | Discharge: 2024-10-03 | Disposition: A | Discharge status: Home / Self Care | Source: Ambulatory Visit | Attending: Physician Assistant | Admitting: Physician Assistant

## 2024-10-03 ENCOUNTER — Other Ambulatory Visit: Payer: Self-pay

## 2024-10-03 ENCOUNTER — Encounter (HOSPITAL_COMMUNITY): Payer: Self-pay

## 2024-10-03 VITALS — BP 128/67 | HR 77 | Temp 98.8°F | Resp 20

## 2024-10-03 DIAGNOSIS — J40 Bronchitis, not specified as acute or chronic: Secondary | ICD-10-CM | POA: Diagnosis not present

## 2024-10-03 DIAGNOSIS — R509 Fever, unspecified: Secondary | ICD-10-CM | POA: Diagnosis not present

## 2024-10-03 DIAGNOSIS — J329 Chronic sinusitis, unspecified: Secondary | ICD-10-CM

## 2024-10-03 LAB — POCT URINE DIPSTICK
Bilirubin, UA: NEGATIVE
Blood, UA: NEGATIVE
Glucose, UA: NEGATIVE mg/dL
Ketones, POC UA: NEGATIVE mg/dL
Leukocytes, UA: NEGATIVE
Nitrite, UA: NEGATIVE
Protein Ur, POC: NEGATIVE mg/dL
Spec Grav, UA: 1.02
Urobilinogen, UA: 0.2 U/dL
pH, UA: 6

## 2024-10-03 LAB — POC COVID19/FLU A&B COMBO
Covid Antigen, POC: NEGATIVE
Influenza A Antigen, POC: NEGATIVE
Influenza B Antigen, POC: NEGATIVE

## 2024-10-03 MED ORDER — AMOXICILLIN-POT CLAVULANATE 875-125 MG PO TABS: 1.0000 | ORAL_TABLET | Freq: Two times a day (BID) | ORAL | 0 refills | Status: AC

## 2024-10-03 NOTE — ED Provider Notes (Signed)
 " [facility]    CSN: [id number] Arrival date & time: 10/03/24  1217      History   Chief Complaint Chief Complaint  Patient presents with   Sore Throat    Temp 101, headache - Entered by patient    HPI Amy Burgess is a 58 y.o. female.   Patient presents today with a 2-day history of recurrent URI symptoms.  She reports that last week she had a mild viral illness but this essentially resolved until recurrence a few days ago.  Over the past several days she has had ongoing sore throat, congestion, cough, fever as high as 101 F, malaise, body aches, headache.  She did have some bladder spasms yesterday but denies any additional urinary symptoms.  Denies any nausea, vomiting, diarrhea.  She has been taking Motrin as well as cold and flu medication without improvement of symptoms.  She was last treated with an antibiotic several months ago and denies any recent steroid use.  She does have a history of asthma and uses albuterol  as needed with acute illness but is not taking any maintenance medication.  She has not required her albuterol  since her symptoms began.  She is up-to-date on influenza and COVID-19 vaccines.  She has not had COVID recently.  Denies any known sick contacts.  She does have a history of MS and is not currently receiving any maintenance medication but has previously been treated with interferon though her last dose was several years ago.    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;  Service: Urology;  Laterality: Right;    OB History   No obstetric history on file.      Home Medications    Prior to Admission medications  Medication Sig Start Date End Date Taking? Authorizing Provider  acyclovir  (ZOVIRAX ) 400 MG tablet Take 1 tablet (400 mg total) by mouth daily. 09/27/24  Yes Whitfield Raisin, NP  amoxicillin -clavulanate (AUGMENTIN ) 875-125 MG tablet Take 1 tablet by mouth every 12 (twelve) hours. 10/03/24  Yes Hila Bolding K, PA-C  amphetamine -dextroamphetamine (ADDERALL  XR) 30 MG 24 hr capsule Take 1 capsule (30 mg total) by mouth daily. 09/27/24  Yes McCue, Raisin, NP  gabapentin  (NEURONTIN ) 800 MG tablet Take 1 tablet (800 mg total) by mouth 2 (two) times daily. 10/20/23  Yes Whitfield Raisin, NP  levothyroxine  (SYNTHROID ) 175 MCG  tablet Take 1 tablet by mouth daily. 11/06/23  Yes [provider]  sertraline  (ZOLOFT ) 100 MG tablet Take 1 tablet (100 mg total) by mouth daily. 10/20/23  Yes McCue, Harlene, NP  tiZANidine  (ZANAFLEX ) 4 MG tablet tizanidine  4 mg tablet TAKE 1 TABLET BY MOUTH THREE TIMES A DAY AS NEEDED 10/20/23  Yes McCue, Harlene, NP  clonazePAM  (KLONOPIN ) 0.5 MG tablet Take 1 tablet (0.5 mg total) by mouth 2 (two) times daily as needed for anxiety. Patient not taking: Reported on 06/22/2024 01/20/24   Tanda Bleacher, MD  conjugated estrogens  (PREMARIN ) vaginal cream 2 grams per applicator vaginally at HS Patient not taking: Reported on  06/13/2024 06/18/23   Tanda Bleacher, MD  ondansetron (ZOFRAN) 4 MG tablet Take 4 mg by mouth every 8 (eight) hours as needed for nausea or vomiting.    [provider]  traZODone  (DESYREL ) 50 MG tablet Take 0.5-1 tablets (25-50 mg total) by mouth at bedtime as needed for sleep. Patient not taking: Reported on 06/13/2024 04/22/24   Whitfield Harlene, NP    Family History Family History  Problem Relation Age of Onset   Heart attack Mother    Early death Mother    Heart disease Mother    Hypertension Father    Diabetes type II Father    Alcohol abuse Father    Arthritis Father    Asthma Father    Diabetes Father    Hearing loss Father    Obesity Father    Multiple sclerosis Paternal Grandmother    Hearing loss Paternal Grandmother    Heart disease Paternal Grandmother    Stroke Paternal Grandmother    Varicose Veins Paternal Grandmother    Heart disease Maternal Grandmother    Stroke Maternal Grandmother    Varicose Veins Maternal Grandmother    Diabetes Paternal Grandfather    Heart disease Paternal Grandfather    Stroke Paternal Grandfather    Alcohol abuse Paternal Uncle    Asthma Paternal Uncle    Varicose Veins Paternal Uncle    Alcohol abuse Paternal Uncle    Asthma Sister    Miscarriages / Stillbirths Sister    Diabetes Son    Early death Maternal Uncle    Early death Paternal Aunt    Kidney disease Paternal Uncle    Miscarriages / Stillbirths Sister     Social History Social History[1]   Allergies   Iodinated contrast media   Review of Systems Review of Systems  Constitutional:  Positive for activity change. Negative for appetite change, fatigue and fever.  HENT:  Positive for congestion and sore throat. Negative for sinus pressure and sneezing.   Respiratory:  Positive for cough. Negative for shortness of breath.   Cardiovascular:  Negative for chest pain.  Gastrointestinal:  Negative for diarrhea, nausea and vomiting.  Musculoskeletal:  Positive for  arthralgias and myalgias.  Neurological:  Positive for headaches. Negative for dizziness and light-headedness.     Physical Exam Triage Vital Signs ED Triage Vitals  Encounter Vitals Group     BP 10/03/24 1236 128/67     Girls Systolic BP Percentile --      Girls Diastolic BP Percentile --      Boys Systolic BP Percentile --      Boys Diastolic BP Percentile --      Pulse Rate 10/03/24 1236 77     Resp 10/03/24 1236 20     Temp 10/03/24 1236 98.8 F (37.1 C)     Temp src --  SpO2 10/03/24 1236 92 %     Weight --      Height --      Head Circumference --      Peak Flow --      Pain Score 10/03/24 1233 5     Pain Loc --      Pain Education --      Exclude from Growth Chart --    No data found.  Updated Vital Signs BP 128/67   Pulse 77   Temp 98.8 F (37.1 C)   Resp 20   LMP  (LMP Unknown)   SpO2 92%   Visual Acuity Right Eye Distance:   Left Eye Distance:   Bilateral Distance:    Right Eye Near:   Left Eye Near:    Bilateral Near:     Physical Exam Vitals reviewed.  Constitutional:      General: She is awake. She is not in acute distress.    Appearance: Normal appearance. She is well-developed. She is not ill-appearing.     Comments: Very pleasant female appears stated age in no acute distress sitting comfortably in exam room wrapped up in her jacket  HENT:     Head: Normocephalic and atraumatic.     Right Ear: Ear canal and external ear normal. A middle ear effusion is present. Tympanic membrane is not erythematous or bulging.     Left Ear: Ear canal and external ear normal. A middle ear effusion is present. Tympanic membrane is not erythematous or bulging.     Nose:     Right Sinus: No maxillary sinus tenderness or frontal sinus tenderness.     Left Sinus: No maxillary sinus tenderness or frontal sinus tenderness.     Mouth/Throat:     Pharynx: Uvula midline. Posterior oropharyngeal erythema present. No oropharyngeal exudate.  Cardiovascular:      Rate and Rhythm: Normal rate and regular rhythm.     Heart sounds: Normal heart sounds, S1 normal and S2 normal. No murmur heard. Pulmonary:     Effort: Pulmonary effort is normal.     Breath sounds: Examination of the right-lower field reveals decreased breath sounds. Examination of the left-lower field reveals decreased breath sounds. Decreased breath sounds present. No wheezing, rhonchi or rales.  Psychiatric:        Behavior: Behavior is cooperative.      UC Treatments / Results  Labs (all labs ordered are listed, but only abnormal results are displayed) Labs Reviewed  POCT URINE DIPSTICK - Normal  POC COVID19/FLU A&B COMBO    EKG   Radiology DG Chest 2 View Result Date: 10/03/2024 EXAM: 2 VIEW(S) XRAY OF THE CHEST 10/03/2024 01:09:02 PM COMPARISON: Chest radiograph dated 02/10/2024. CLINICAL HISTORY: Fever and cough. FINDINGS: LUNGS AND PLEURA: No focal pulmonary opacity. No pleural effusion. No pneumothorax. HEART AND MEDIASTINUM: No acute abnormality of the cardiac and mediastinal silhouettes. BONES AND SOFT TISSUES: No acute osseous abnormality. IMPRESSION: 1. No acute cardiopulmonary process. Electronically signed by: Selinda Blue MD 10/03/2024 01:54 PM EST RP Workstation: [id number]    Procedures Procedures (including critical care time)  Medications Ordered in UC Medications - No data to display  Initial Impression / Assessment and Plan / UC Course  I have reviewed the triage vital signs and the nursing notes.  Pertinent labs & imaging results that were available during my care of the patient were reviewed by me and considered in my medical decision making (see chart for details).     Patient is  well-appearing, afebrile, nontoxic, nontachycardic.  It is unclear whether or not she is having secondary bacterial infection that has worsened from her cold a week ago or if this is a second illness.  She did test negative for COVID and flu and so we will treat for secondary  bacterial infection.  Chest x-ray was obtained that showed no acute cardiopulmonary disease.  She reports some bladder spasms and so her urine was obtained that was normal making UTI less likely.  Will cover with Augmentin  twice daily for 10 days for sinobronchitis.  No indication for dose adjustment based on metabolic panel from 09/24/2023 with a creatinine of 0.94 calculated creatinine clearance of 93 mL/min.  She was encouraged to continue over-the-counter medication including Tylenol, Mucinex, Flonase , nasal saline/sinus rinses.  We discussed that if she is not feeling better within 3 to 5 days she should return for reevaluation.  If she has any worsening symptoms including high fever, worsening cough, shortness of breath, chest pain, weakness she needs to be seen emergently.  Return precautions given.  Patient expressed understanding and agreement to treatment plan.  Final Clinical Impressions(s) / UC Diagnoses   Final diagnoses:  Fever, unspecified  Sinobronchitis     Discharge Instructions      Your chest x-ray was normal with no evidence of pneumonia.  You were negative for COVID and flu.  Your urine was normal.  We are starting Augmentin  to cover for a sinus/bronchitis infection.  Take this twice daily for 10 days.  I also recommend over-the-counter medication including Tylenol, Mucinex, Flonase , ibuprofen, nasal saline/sinus rinses, humidifier in your room to help with your symptoms.  If you are not feeling better within 3 to 5 days please return for reevaluation.  If anything worsens and you have high fever not responding to medication, worsening cough, shortness of breath, chest pain, nausea/vomiting, weakness you need to be seen immediately     ED Prescriptions     Medication Sig Dispense Auth. Provider   amoxicillin -clavulanate (AUGMENTIN ) 875-125 MG tablet Take 1 tablet by mouth every 12 (twelve) hours. 20 tablet Taevion Sikora K, PA-C      PDMP not reviewed this encounter.      [1]  Social History Tobacco Use   Smoking status: Former    Current packs/day: 0.00    Average packs/day: 0.5 packs/day for 3.0 years (1.5 ttl pk-yrs)    Types: Cigarettes    Quit date: 1985    Years since quitting: 41.0   Smokeless tobacco: Never   Tobacco comments:    It was during my dumb teenage years  Vaping Use   Vaping status: Never Used  Substance Use Topics   Alcohol use: Not Currently    Comment: 3 drinks in last 6 months   Drug use: Never     Sherrell Rocky POUR, PA-C 10/03/24 1426  "

## 2024-10-03 NOTE — ED Triage Notes (Signed)
 PT had cold like Sx's last week and felt better by Friday. Felt worse yesterday the patient now has a sore throat,fever 101. Pt has a dry cough the patient with a hx of Asthma and is slightly wheezing.. PT took 1200 mg of Motrin because that was the dose given in oncology.

## 2024-10-03 NOTE — Discharge Instructions (Signed)
 Your chest x-ray was normal with no evidence of pneumonia.  You were negative for COVID and flu.  Your urine was normal.  We are starting Augmentin  to cover for a sinus/bronchitis infection.  Take this twice daily for 10 days.  I also recommend over-the-counter medication including Tylenol, Mucinex, Flonase , ibuprofen, nasal saline/sinus rinses, humidifier in your room to help with your symptoms.  If you are not feeling better within 3 to 5 days please return for reevaluation.  If anything worsens and you have high fever not responding to medication, worsening cough, shortness of breath, chest pain, nausea/vomiting, weakness you need to be seen immediately

## 2024-10-28 ENCOUNTER — Other Ambulatory Visit: Payer: Self-pay | Admitting: Adult Health

## 2024-10-28 DIAGNOSIS — G35D Multiple sclerosis, unspecified: Secondary | ICD-10-CM

## 2024-10-28 DIAGNOSIS — F32A Depression, unspecified: Secondary | ICD-10-CM

## 2024-11-02 ENCOUNTER — Encounter: Payer: Self-pay | Admitting: Adult Health

## 2024-11-02 DIAGNOSIS — F988 Other specified behavioral and emotional disorders with onset usually occurring in childhood and adolescence: Secondary | ICD-10-CM

## 2024-11-02 NOTE — Telephone Encounter (Signed)
 Requested Prescriptions   Pending Prescriptions Disp Refills   amphetamine -dextroamphetamine (ADDERALL  XR) 30 MG 24 hr capsule 30 capsule 0    Sig: Take 1 capsule (30 mg total) by mouth daily.   Last seen 04/22/24 Next appt 11/30/24  Dispenses   Dispensed Days Supply Quantity Provider Pharmacy  AMPHETAM/DEXTROAMP ER 30MG  [facility] 09/27/2024 30 30 each Whitfield Raisin, NP [facility]  AMPHETAM/DEXTROAMP ER 30MG  [facility] 08/16/2024 30 30 each Whitfield Raisin, NP [address] [facility]  AMPHETAM/DEXTROAMP ER 30MG  [facility] 07/15/2024 30 30 each Sater, Charlie LABOR, MD [facility]  AMPHETAM/DEXTROAMP ER 30MG  [facility] 06/10/2024 30 30 each Whitfield Raisin, NP [address] [facility]  AMPHETAM/DEXTROAMP ER 30MG  [facility] 05/11/2024 30 30 each Whitfield Raisin, NP [address] [facility]  AMPHETAM/DEXTROAMP ER 30MG  [facility] 03/29/2024 30 30 each Whitfield Raisin, NP [facility]  AMPHETAM/DEXTROAMP ER 30MG  [facility] 02/24/2024 30 30 each Sater, Charlie LABOR, MD [facility]  AMPHETAM/DEXTROAMP ER 30MG  [facility] 01/20/2024 30 30 each Sater, Charlie LABOR, MD [facility]  AMPHETAM/DEXTROAMP ER 30MG  [facility] 12/17/2023 30 30 each Sater, Charlie LABOR, MD [facility]  AMPHETAM/DEXTROAMP ER 30MG  [facility] 11/27/2023 10 10 each Whitfield Raisin, NP [email or web address]

## 2024-11-03 MED ORDER — AMPHETAMINE-DEXTROAMPHET ER 30 MG PO CP24: 30.0000 mg | ORAL_CAPSULE | Freq: Every day | ORAL | 0 refills | Status: AC

## 2024-11-11 ENCOUNTER — Other Ambulatory Visit: Payer: Self-pay | Admitting: Adult Health

## 2024-11-11 DIAGNOSIS — G35D Multiple sclerosis, unspecified: Secondary | ICD-10-CM

## 2024-11-30 ENCOUNTER — Ambulatory Visit: Admitting: Neurology

## 2024-12-22 ENCOUNTER — Ambulatory Visit: Payer: Medicare Other
